# Patient Record
Sex: Male | Born: 1960
Health system: Southern US, Community
[De-identification: ages and names within clinical notes are randomized; demographics above are authoritative.]

## PROBLEM LIST (undated history)

## (undated) DIAGNOSIS — M48061 Spinal stenosis, lumbar region without neurogenic claudication: Secondary | ICD-10-CM

## (undated) DIAGNOSIS — I10 Essential (primary) hypertension: Secondary | ICD-10-CM

## (undated) DIAGNOSIS — M5417 Radiculopathy, lumbosacral region: Secondary | ICD-10-CM

## (undated) DIAGNOSIS — Z5189 Encounter for other specified aftercare: Secondary | ICD-10-CM

## (undated) DIAGNOSIS — R011 Cardiac murmur, unspecified: Secondary | ICD-10-CM

## (undated) DIAGNOSIS — F1910 Other psychoactive substance abuse, uncomplicated: Secondary | ICD-10-CM

## (undated) HISTORY — DX: Other psychoactive substance abuse, uncomplicated: F19.10

## (undated) HISTORY — PX: PANCREAS SURGERY: SHX731

## (undated) HISTORY — DX: Essential (primary) hypertension: I10

## (undated) HISTORY — DX: Cardiac murmur, unspecified: R01.1

## (undated) HISTORY — DX: Radiculopathy, lumbosacral region: M54.17

## (undated) HISTORY — PX: CORONARY ARTERY BYPASS GRAFT: SHX141

## (undated) HISTORY — DX: Spinal stenosis, lumbar region without neurogenic claudication: M48.061

## (undated) HISTORY — DX: Encounter for other specified aftercare: Z51.89

---

## 2004-02-21 ENCOUNTER — Emergency Department (HOSPITAL_COMMUNITY): Admission: EM | Admit: 2004-02-21 | Discharge: 2004-02-21 | Payer: Self-pay | Admitting: Family Medicine

## 2004-03-26 ENCOUNTER — Emergency Department (HOSPITAL_COMMUNITY): Admission: EM | Admit: 2004-03-26 | Discharge: 2004-03-26 | Payer: Self-pay | Admitting: Family Medicine

## 2008-02-15 ENCOUNTER — Emergency Department (HOSPITAL_COMMUNITY): Admission: EM | Admit: 2008-02-15 | Discharge: 2008-02-15 | Payer: Self-pay | Admitting: Emergency Medicine

## 2008-10-30 ENCOUNTER — Emergency Department (HOSPITAL_COMMUNITY): Admission: EM | Admit: 2008-10-30 | Discharge: 2008-10-30 | Payer: Self-pay | Admitting: Emergency Medicine

## 2008-11-01 ENCOUNTER — Emergency Department (HOSPITAL_COMMUNITY): Admission: EM | Admit: 2008-11-01 | Discharge: 2008-11-01 | Payer: Self-pay | Admitting: Emergency Medicine

## 2009-03-22 ENCOUNTER — Emergency Department (HOSPITAL_COMMUNITY): Admission: EM | Admit: 2009-03-22 | Discharge: 2009-03-22 | Payer: Self-pay | Admitting: Family Medicine

## 2009-06-04 ENCOUNTER — Emergency Department (HOSPITAL_COMMUNITY): Admission: EM | Admit: 2009-06-04 | Discharge: 2009-06-04 | Payer: Self-pay | Admitting: Family Medicine

## 2009-06-05 ENCOUNTER — Ambulatory Visit: Payer: Self-pay | Admitting: Family Medicine

## 2009-06-05 LAB — CONVERTED CEMR LAB
ALT: 15 units/L (ref 0–53)
BUN: 16 mg/dL (ref 6–23)
CO2: 25 meq/L (ref 19–32)
Calcium: 9.4 mg/dL (ref 8.4–10.5)
Creatinine, Ser: 1.31 mg/dL (ref 0.40–1.50)
Eosinophils Relative: 1 % (ref 0–5)
Monocytes Absolute: 0.6 10*3/uL (ref 0.1–1.0)
Neutro Abs: 4.8 10*3/uL (ref 1.7–7.7)
Platelets: 278 10*3/uL (ref 150–400)
RBC: 4.96 M/uL (ref 4.22–5.81)
RDW: 13.9 % (ref 11.5–15.5)
Sodium: 140 meq/L (ref 135–145)
Total Protein: 7.5 g/dL (ref 6.0–8.3)
Vit D, 25-Hydroxy: 37 ng/mL (ref 30–89)
WBC: 7.9 10*3/uL (ref 4.0–10.5)

## 2009-06-08 ENCOUNTER — Ambulatory Visit (HOSPITAL_COMMUNITY): Admission: RE | Admit: 2009-06-08 | Discharge: 2009-06-08 | Payer: Self-pay | Admitting: Family Medicine

## 2009-07-16 ENCOUNTER — Ambulatory Visit: Payer: Self-pay | Admitting: Internal Medicine

## 2010-02-04 ENCOUNTER — Encounter
Admission: RE | Admit: 2010-02-04 | Discharge: 2010-02-05 | Payer: Self-pay | Source: Home / Self Care | Attending: Physical Medicine & Rehabilitation | Admitting: Physical Medicine & Rehabilitation

## 2010-02-05 ENCOUNTER — Ambulatory Visit
Admission: RE | Admit: 2010-02-05 | Discharge: 2010-02-05 | Payer: Self-pay | Source: Home / Self Care | Attending: Physical Medicine & Rehabilitation | Admitting: Physical Medicine & Rehabilitation

## 2010-02-19 ENCOUNTER — Encounter
Admission: RE | Admit: 2010-02-19 | Discharge: 2010-03-05 | Payer: Self-pay | Source: Home / Self Care | Attending: Physical Medicine & Rehabilitation | Admitting: Physical Medicine & Rehabilitation

## 2010-03-06 ENCOUNTER — Encounter: Payer: Self-pay | Admitting: Rehabilitative and Restorative Service Providers"

## 2010-03-11 ENCOUNTER — Encounter: Payer: Self-pay | Admitting: Physical Medicine & Rehabilitation

## 2010-03-11 ENCOUNTER — Ambulatory Visit: Payer: Self-pay | Attending: Physical Medicine & Rehabilitation

## 2010-03-11 DIAGNOSIS — IMO0002 Reserved for concepts with insufficient information to code with codable children: Secondary | ICD-10-CM

## 2010-03-11 DIAGNOSIS — M48061 Spinal stenosis, lumbar region without neurogenic claudication: Secondary | ICD-10-CM

## 2010-03-13 ENCOUNTER — Encounter: Payer: Self-pay | Admitting: Rehabilitative and Restorative Service Providers"

## 2010-03-14 ENCOUNTER — Encounter: Payer: Self-pay | Admitting: Rehabilitative and Restorative Service Providers"

## 2010-03-28 ENCOUNTER — Encounter (INDEPENDENT_AMBULATORY_CARE_PROVIDER_SITE_OTHER): Payer: Self-pay | Admitting: *Deleted

## 2010-03-28 LAB — CONVERTED CEMR LAB: Testosterone: 296.91 ng/dL (ref 250–890)

## 2010-04-15 ENCOUNTER — Encounter: Payer: Medicaid Other | Attending: Physical Medicine & Rehabilitation

## 2010-04-15 ENCOUNTER — Encounter: Payer: Self-pay | Admitting: Physical Medicine & Rehabilitation

## 2010-04-15 DIAGNOSIS — M545 Low back pain, unspecified: Secondary | ICD-10-CM | POA: Insufficient documentation

## 2010-04-15 DIAGNOSIS — IMO0002 Reserved for concepts with insufficient information to code with codable children: Secondary | ICD-10-CM | POA: Insufficient documentation

## 2010-04-24 LAB — POCT I-STAT, CHEM 8
BUN: 18 mg/dL (ref 6–23)
Creatinine, Ser: 1.4 mg/dL (ref 0.4–1.5)
Hemoglobin: 16 g/dL (ref 13.0–17.0)
Potassium: 3.7 mEq/L (ref 3.5–5.1)
Sodium: 139 mEq/L (ref 135–145)

## 2010-06-11 ENCOUNTER — Ambulatory Visit (HOSPITAL_BASED_OUTPATIENT_CLINIC_OR_DEPARTMENT_OTHER): Payer: Self-pay | Admitting: Physical Medicine & Rehabilitation

## 2010-06-11 ENCOUNTER — Encounter: Payer: Medicaid Other | Attending: Physical Medicine & Rehabilitation

## 2010-06-11 DIAGNOSIS — IMO0002 Reserved for concepts with insufficient information to code with codable children: Secondary | ICD-10-CM

## 2010-06-11 DIAGNOSIS — M48061 Spinal stenosis, lumbar region without neurogenic claudication: Secondary | ICD-10-CM

## 2010-06-11 DIAGNOSIS — M545 Low back pain, unspecified: Secondary | ICD-10-CM | POA: Insufficient documentation
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
## 2010-06-24 ENCOUNTER — Other Ambulatory Visit: Payer: Self-pay | Admitting: Neurosurgery

## 2010-06-24 DIAGNOSIS — M5126 Other intervertebral disc displacement, lumbar region: Secondary | ICD-10-CM

## 2010-06-24 DIAGNOSIS — M545 Low back pain: Secondary | ICD-10-CM

## 2010-06-26 ENCOUNTER — Other Ambulatory Visit: Payer: Self-pay

## 2010-07-02 ENCOUNTER — Other Ambulatory Visit: Payer: Self-pay

## 2010-07-02 ENCOUNTER — Inpatient Hospital Stay: Admission: RE | Admit: 2010-07-02 | Payer: Self-pay | Source: Ambulatory Visit

## 2010-08-06 ENCOUNTER — Ambulatory Visit (HOSPITAL_BASED_OUTPATIENT_CLINIC_OR_DEPARTMENT_OTHER): Payer: Medicaid Other | Admitting: Physical Medicine & Rehabilitation

## 2010-08-06 ENCOUNTER — Encounter: Payer: Medicaid Other | Attending: Physical Medicine & Rehabilitation

## 2010-08-06 DIAGNOSIS — IMO0002 Reserved for concepts with insufficient information to code with codable children: Secondary | ICD-10-CM

## 2010-08-06 DIAGNOSIS — M545 Low back pain, unspecified: Secondary | ICD-10-CM | POA: Insufficient documentation

## 2010-08-06 NOTE — Assessment & Plan Note (Signed)
HISTORY:  Mr.  Alex Hogan returns today.  He is seen by Neurosurgery, did not feel operative treatment of this was necessary.  L2-3 herniated nucleus pulposus with right thigh pain.  Has responded in the past to epidural steroid injection.  He is no longer taking his Neurontin or Arthrotec, this can be refilled.  He has had UDS positive for THC.  8/10 pain.  PHYSICAL EXAMINATION:  VITAL SIGNS:  139/70, pulse 79, respirations 12, and O2 sat 98% on room air. GENERAL:  No acute stress.  Mood and affect appropriate.  Straight leg raising test negative.  He has no weakness in the lower extremity.  His back has mild tenderness mainly pain with forward flexion, not with extension.  IMPRESSION: 1. Lumbar disk L2-3.  Repeat lumbar epidural steroid injection. 2. Resume Arthrotec 75 b.i.d. and Neurontin 300 t.i.d.  Discussed with the patient, agrees with plan.     Erick Colace, M.D. Electronically Signed    AEK/MedQ D:  08/06/2010 11:12:53  T:  08/06/2010 14:14:42  Job #:  [id number]

## 2010-09-05 ENCOUNTER — Encounter: Payer: Medicaid Other | Attending: Physical Medicine & Rehabilitation

## 2010-09-05 ENCOUNTER — Encounter (HOSPITAL_BASED_OUTPATIENT_CLINIC_OR_DEPARTMENT_OTHER): Payer: Medicaid Other | Admitting: Physical Medicine & Rehabilitation

## 2010-09-05 DIAGNOSIS — M545 Low back pain, unspecified: Secondary | ICD-10-CM | POA: Insufficient documentation

## 2010-09-05 DIAGNOSIS — IMO0002 Reserved for concepts with insufficient information to code with codable children: Secondary | ICD-10-CM | POA: Insufficient documentation

## 2010-09-09 NOTE — Procedures (Signed)
Alex Hogan, Alex Hogan            ACCOUNT NO.:  [id number]  MEDICAL RECORD NO.:  [id number]           PATIENT TYPE:  LOCATION:                                 FACILITY:  PHYSICIAN:  Erick Colace, M.D.DATE OF BIRTH:  1960/05/29  DATE OF PROCEDURE:  09/05/2010 DATE OF DISCHARGE:                              OPERATIVE REPORT  PROCEDURE:  Right L2-3 paramedian translaminar lumbar epidural steroid injection under fluoroscopic guidance.  INDICATION:  Lumbar pain radiating to the right thigh with previous good results from L2-3 right paramedian injection on April 15, 2010.  He is here for repeat injection and pain is only partially responsive to medication management, other conservative care, and interferes with self- care and mobility.  Informed consent was obtained after describing risks and benefits of the procedure with the patient.  These include bleeding, bruising, and infection.  He elects to proceed and has given written consent.  The patient was placed prone on fluoroscopy table.  Betadine prep, sterile drape, 25-gauge inch and half needle was used to anesthetize skin and subcu tissue 1% lidocaine x2 mL.  Then, a 22-gauge 3-1/2-inch spinal needle was inserted under fluoroscopic guidance, starting right L2-3 interlaminar space.  AP and lateral images were utilized.  Omnipaque 180 under live fluoro demonstrated good epidural spread after loss-of- resistance demonstrated possible loss under lateral imaging.  Then, a solution containing 2 mL of 40 mg/mL Depo-Medrol and 2 mL of 1% MPF lidocaine were injected.  The patient tolerated the procedure well. Postprocedure instructions given.  We will schedule repeat injection in about 4 months.     Erick Colace, M.D. Electronically Signed    AEK/MEDQ  D:  09/05/2010 12:31:02  T:  09/05/2010 13:13:44  Job:  [id number]

## 2010-12-23 ENCOUNTER — Encounter (HOSPITAL_BASED_OUTPATIENT_CLINIC_OR_DEPARTMENT_OTHER): Payer: Medicaid Other | Admitting: Physical Medicine & Rehabilitation

## 2010-12-23 ENCOUNTER — Encounter: Payer: Medicaid Other | Attending: Physical Medicine & Rehabilitation

## 2010-12-23 DIAGNOSIS — M545 Low back pain, unspecified: Secondary | ICD-10-CM | POA: Insufficient documentation

## 2010-12-23 DIAGNOSIS — IMO0002 Reserved for concepts with insufficient information to code with codable children: Secondary | ICD-10-CM | POA: Insufficient documentation

## 2010-12-23 NOTE — Procedures (Signed)
[name], [name]            ACCOUNT NO.:  [id number]  MEDICAL RECORD NO.:  [id number]           PATIENT TYPE:  O  LOCATION:  [facility]                          FACILITY:  [facility]  PHYSICIAN:  Erick Colace, M.D.DATE OF BIRTH:  08/29/1960  DATE OF PROCEDURE:  12/23/2010 DATE OF DISCHARGE:                              OPERATIVE REPORT  PROCEDURE:  Right L2-3 translaminar lumbar epidural steroid injection under fluoroscopic guidance.  INDICATION:  Lumbar pain only partially response to medication management, other conservative care.  Informed consent was obtained after describing risks and benefits of the procedure with the patient.  These include bleeding, bruising, infection.  He elects proceed and has given written consent.  The patient placed prone on fluoroscopy table.  Betadine prep, sterile drape, 25-gauge inch and half needle was used to anesthetize skin and subcu tissue, 1% lidocaine x2 mL.  Then, a17-gauge Tuohy needle was inserted under fluoroscopic guidance, targeting the L2-3 interlaminar space.  AP and lateral images utilized.  Once needle tip approximated, posterior elements, loss of resistance using 50:50 air saline mix was used.  Possible loss resistance was confirmed with 2 mL of Omnipaque 180 followed by injection of 2 mL of 1% MPF lidocaine plus 2 mL of Kenalog 40 mg/mL.  The patient tolerated procedure well.  Postprocedure instructions given.     Erick Colace, M.D. Electronically Signed    AEK/MEDQ  D:  12/23/2010 10:55:13  T:  12/23/2010 12:25:01  Job:  [id number]

## 2011-03-17 ENCOUNTER — Encounter: Payer: Medicaid Other | Admitting: Physical Medicine & Rehabilitation

## 2011-03-17 ENCOUNTER — Encounter: Payer: Medicaid Other | Attending: Physical Medicine & Rehabilitation

## 2011-03-17 DIAGNOSIS — IMO0002 Reserved for concepts with insufficient information to code with codable children: Secondary | ICD-10-CM | POA: Insufficient documentation

## 2011-03-17 DIAGNOSIS — M545 Low back pain, unspecified: Secondary | ICD-10-CM | POA: Insufficient documentation

## 2011-03-18 NOTE — Procedures (Signed)
Alex Hogan, Alex Hogan            ACCOUNT NO.:  [id number]  MEDICAL RECORD NO.:  [id number]           PATIENT TYPE:  LOCATION:                                 FACILITY:  PHYSICIAN:  Erick Colace, M.D.DATE OF BIRTH:  19-Apr-1960  DATE OF PROCEDURE: DATE OF DISCHARGE:                              OPERATIVE REPORT  PROCEDURE:  Right L2-3 paramedian lumbar epidural steroid injection under fluoroscopic guidance.  INDICATION:  Lumbar spinal stenosis and lumbar radiculitis.  He gets about 3 months relief after epidural injections, not requiring medications at this point.  Last injection December 23, 2010.  Informed consent was obtained after describing risks and benefits of the procedure with the patient.  These include bleeding, bruising, and infection.  He elects to proceed and has given written consent.  Proper patient and proper procedure confirmed.  His radicular pain has not responded to other conservative care.  The patient was placed prone on fluoroscopy table.  Betadine prep, sterile drape, 25-gauge inch and half needle was used to anesthetize skin and subcu tissue with 1% lidocaine x2 mL.  Then, a 17-gauge Tuohy needle was inserted under fluoroscopic guidance, starting at the right L2-3 interlaminar space.  AP and lateral images utilized.  Once needle tip approximated posterior elements loss resistance, 50:50 air saline mix was used.  Loss resistance was confirmed with 2 mL of Omnipaque 180 demonstrating good epidural spread followed by injection of 2 mL of 1% MPF lidocaine and 2 mL of 40 mg/mL Depo-Medrol.  The patient tolerated the procedure well.  Postprocedure instructions given.     Erick Colace, M.D. Electronically Signed    AEK/MEDQ  D:  03/17/2011 11:45:32  T:  03/17/2011 23:14:00  Job:  [id number]

## 2011-06-10 ENCOUNTER — Ambulatory Visit (HOSPITAL_BASED_OUTPATIENT_CLINIC_OR_DEPARTMENT_OTHER): Payer: Medicaid Other | Admitting: Physical Medicine & Rehabilitation

## 2011-06-10 ENCOUNTER — Encounter: Payer: Medicaid Other | Attending: Physical Medicine & Rehabilitation

## 2011-06-10 ENCOUNTER — Encounter: Payer: Self-pay | Admitting: Physical Medicine & Rehabilitation

## 2011-06-10 VITALS — BP 134/82 | HR 56 | Ht 75.0 in | Wt 224.0 lb

## 2011-06-10 DIAGNOSIS — M545 Low back pain, unspecified: Secondary | ICD-10-CM | POA: Insufficient documentation

## 2011-06-10 DIAGNOSIS — M5416 Radiculopathy, lumbar region: Secondary | ICD-10-CM | POA: Insufficient documentation

## 2011-06-10 DIAGNOSIS — IMO0002 Reserved for concepts with insufficient information to code with codable children: Secondary | ICD-10-CM

## 2011-06-10 NOTE — Progress Notes (Addendum)
PROCEDURE: Right L2-3 paramedian lumbar epidural steroid injection  under fluoroscopic guidance.  INDICATION: Lumbar spinal stenosis and lumbar radiculitis. He gets  about 3 months relief after epidural injections, not requiring  medications at this point. Last injection December 23, 2010.  Informed consent was obtained after describing risks and benefits of the  procedure with the patient. These include bleeding, bruising, and  infection. He elects to proceed and has given written consent. Proper  patient and proper procedure confirmed. His radicular pain has not  responded to other conservative care. The patient was placed prone on  fluoroscopy table. Betadine prep, sterile drape, 25-gauge inch and half  needle was used to anesthetize skin and subcu tissue with 1% lidocaine  x2 mL. Then, a 17-gauge Tuohy needle was inserted under fluoroscopic  guidance, starting at the right L2-3 interlaminar space. AP and lateral  images utilized. Once needle tip approximated posterior elements loss  resistance, 50:50 air saline mix was used. Loss resistance was  confirmed with 2 mL of Omnipaque 180 demonstrating good epidural spread  followed by injection of 2 mL of 1% MPF lidocaine and 2 mL of 40 mg/mL   Please note there was an injection 03/17/11  Depo-Medrol. The patient tolerated the procedure well. Postprocedure  instructions given.

## 2011-06-10 NOTE — Progress Notes (Signed)
  PROCEDURE RECORD [facility]   Name: Alex Hogan DOB:Jun 12, 1960 MRN: [id number]  Date:06/10/2011  Physician: Alex Laws, MD    Nurse/CMA: Shumaker RN  Allergies:  Allergies  Allergen Reactions  . Reglan (Metoclopramide Hcl)     Consent Signed: yes  Is patient diabetic? no  CBG today?   Pregnant: no LMP: No LMP for male patient. (age 52-55)  Anticoagulants: no Anti-inflammatory: no Antibiotics: no  Procedure: Lumbar Epidural Steroid Injection L2-3 Position: Prone Start Time: 11:14am End Time: 11:18am Fluoro Time: 15 sec  RN/CMA Harley Fitzwater Shumaker RN    Time 10:46 am 11:21    BP 134/82   154/80    Pulse 56 55    Respirations 14   16    O2 Sat 98 100%    S/S 6 6    Pain Level 1 0     D/C home with Alex (wife), patient A & O X 3, D/C instructions reviewed, and sits independently.

## 2011-06-10 NOTE — Patient Instructions (Signed)
Epidural Steroid Injection Care After  Refer to this sheet in the next few weeks. These instructions provide you with information on caring for yourself after your procedure. Your caregiver may also give you more specific instructions. Your treatment has been planned according to current medical practices, but problems sometimes occur. Call your caregiver if you have any problems or questions after your procedure. HOME CARE INSTRUCTIONS   Avoid the use of heat on the injection site for a day.   Do not have a tub bath or soak in water for the rest of the day.   Remove the bandage on the next day.   Resume your normal activities on the next day.   Use ice packs or mild pain relievers to reduce the soreness around the injection site.   Follow up with your caregiver 7 to 10 days after the procedure.  SEEK MEDICAL CARE IF:   You develop a fever of more than 100.5 F (38.1 C).   You continue to have pain and soreness over the injection site even after taking medicines.   You develop significant nausea or vomiting.  SEEK IMMEDIATE MEDICAL CARE IF:   You have severe back pain, which is not relieved by medicines.   You develop severe headache, stiff neck, or sensitivity to light.   You develop any new numbness or weakness of your legs.   You lose control over your bladder or bowel movements.   You develop a fever of more than 102 F (38.9 C).   You develop difficulty breathing.  Document Released: 05/07/2010 Document Revised: 01/09/2011 Document Reviewed: 05/07/2010 [employer] Patient Information 2012 [employer]. 

## 2011-09-04 ENCOUNTER — Ambulatory Visit: Payer: Medicaid Other | Admitting: Physical Medicine & Rehabilitation

## 2011-09-04 ENCOUNTER — Encounter: Payer: Medicaid Other | Attending: Physical Medicine & Rehabilitation

## 2011-09-04 DIAGNOSIS — IMO0002 Reserved for concepts with insufficient information to code with codable children: Secondary | ICD-10-CM | POA: Insufficient documentation

## 2011-09-04 DIAGNOSIS — M545 Low back pain, unspecified: Secondary | ICD-10-CM | POA: Insufficient documentation

## 2011-10-23 ENCOUNTER — Ambulatory Visit: Payer: Medicaid Other | Admitting: Physical Medicine & Rehabilitation

## 2011-10-23 ENCOUNTER — Encounter: Payer: Medicaid Other | Attending: Physical Medicine & Rehabilitation

## 2011-10-23 ENCOUNTER — Encounter: Payer: Self-pay | Admitting: Physical Medicine & Rehabilitation

## 2011-10-23 DIAGNOSIS — IMO0002 Reserved for concepts with insufficient information to code with codable children: Secondary | ICD-10-CM | POA: Insufficient documentation

## 2011-10-23 DIAGNOSIS — M545 Low back pain, unspecified: Secondary | ICD-10-CM | POA: Insufficient documentation

## 2011-10-23 NOTE — Progress Notes (Unsigned)
  PROCEDURE RECORD [facility]   Name: Alex Hogan DOB:12-25-1960 MRN: [id number]  Date:10/23/2011  Physician: Claudette Laws, MD    Nurse/CMA: Shemica Meath/Carroll  Allergies:  Allergies  Allergen Reactions  . Reglan (Metoclopramide Hcl)     Consent Signed: yes  Is patient diabetic? no   Pregnant: no LMP: No LMP for male patient. (age 52-55)  Anticoagulants: no Anti-inflammatory: no Antibiotics: no  Procedure: lumbar epidural steroid injection  Position: Prone Start Time:   End Time:   Fluoro Time:   RN/CMA Sanita Estrada     Time 10:05 am     BP 142/81     Pulse 61     Respirations 14     O2 Sat 99     S/S 6 6    Pain Level 7/10      D/C home with Claudette-wife, patient A & O X 3, D/C instructions reviewed, and sits independently.

## 2011-10-28 ENCOUNTER — Telehealth: Payer: Self-pay | Admitting: *Deleted

## 2011-10-28 ENCOUNTER — Ambulatory Visit: Payer: Medicaid Other | Admitting: Physical Medicine & Rehabilitation

## 2011-10-28 NOTE — Telephone Encounter (Signed)
Cannot come in for appt today because his wife is in the hospital. Please call about appointment.  # [phone]

## 2011-12-02 ENCOUNTER — Encounter: Payer: Self-pay | Admitting: Physical Medicine & Rehabilitation

## 2011-12-02 ENCOUNTER — Ambulatory Visit (HOSPITAL_BASED_OUTPATIENT_CLINIC_OR_DEPARTMENT_OTHER): Payer: Medicaid Other | Admitting: Physical Medicine & Rehabilitation

## 2011-12-02 ENCOUNTER — Encounter: Payer: Medicaid Other | Attending: Physical Medicine & Rehabilitation

## 2011-12-02 VITALS — BP 121/70 | HR 62 | Resp 14 | Ht 75.0 in | Wt 221.0 lb

## 2011-12-02 DIAGNOSIS — M5416 Radiculopathy, lumbar region: Secondary | ICD-10-CM

## 2011-12-02 DIAGNOSIS — M545 Low back pain, unspecified: Secondary | ICD-10-CM | POA: Insufficient documentation

## 2011-12-02 DIAGNOSIS — IMO0002 Reserved for concepts with insufficient information to code with codable children: Secondary | ICD-10-CM | POA: Insufficient documentation

## 2011-12-02 NOTE — Progress Notes (Signed)
  PROCEDURE RECORD [facility]   Name: Alex Hogan DOB:[birth year] MRN: [id number]  Date:12/02/2011  Physician: Claudette Laws, MD    Nurse/CMA: Kelli Churn,  Allergies:  Allergies  Allergen Reactions  . Reglan (Metoclopramide Hcl)     Consent Signed: yes  Is patient diabetic? no    Pregnant: no LMP: No LMP for male patient. (age 52-55)  Anticoagulants: no Anti-inflammatory: no Antibiotics: no  Procedure:  Lumbar epidural injection Position: Prone Start Time:  122 End Time: 130p  Fluoro Time:15   RN/CMA Levens, CMA Levens, CMA    Time 111 133    BP 121/70 141/77    Pulse 62 67    Respirations 14 14    O2 Sat 98 100    S/S 6 6    Pain Level 9/10 6/10     D/C home with [name], patient A & O X 3, D/C instructions reviewed, and sits independently.

## 2011-12-02 NOTE — Progress Notes (Signed)
PROCEDURE: Right L2-3 paramedian lumbar epidural steroid injection  under fluoroscopic guidance.  INDICATION: Lumbar spinal stenosis and lumbar radiculitis. He gets  about 3 months relief after epidural injections, not requiring  medications at this point. Last injection December 23, 2010.  Informed consent was obtained after describing risks and benefits of the  procedure with the patient. These include bleeding, bruising, and  infection. He elects to proceed and has given written consent. Proper  patient and proper procedure confirmed. His radicular pain has not  responded to other conservative care. The patient was placed prone on  fluoroscopy table. Betadine prep, sterile drape, 25-gauge inch and half  needle was used to anesthetize skin and subcu tissue with 1% lidocaine  x2 mL. Then, a 17-gauge Tuohy needle was inserted under fluoroscopic  guidance, starting at the right L2-3 interlaminar space. AP and lateral  images utilized. Once needle tip approximated posterior elements loss  resistance, 50:50 air saline mix was used. Loss resistance was  confirmed with 2 mL of Omnipaque 180 demonstrating good epidural spread  followed by injection of 2 mL of 1% MPF lidocaine and 2 mL of 40 mg/mL  Depo-Medrol. The patient tolerated the procedure well. Postprocedure  instructions given.

## 2011-12-02 NOTE — Patient Instructions (Signed)
Epidural Steroid Injection Care After  Refer to this sheet in the next few weeks. These instructions provide you with information on caring for yourself after your procedure. Your caregiver may also give you more specific instructions. Your treatment has been planned according to current medical practices, but problems sometimes occur. Call your caregiver if you have any problems or questions after your procedure. HOME CARE INSTRUCTIONS   Avoid the use of heat on the injection site for a day.  Do not have a tub bath or soak in water for the rest of the day.  Remove the bandage on the next day.  Resume your normal activities on the next day.  Use ice packs or mild pain relievers to reduce the soreness around the injection site.  Follow up with your caregiver 7 to 10 days after the procedure. SEEK MEDICAL CARE IF:   You develop a fever of more than 100.5 F (38.1 C).  You continue to have pain and soreness over the injection site even after taking medicines.  You develop significant nausea or vomiting. SEEK IMMEDIATE MEDICAL CARE IF:   You have severe back pain, which is not relieved by medicines.  You develop severe headache, stiff neck, or sensitivity to light.  You develop any new numbness or weakness of your legs.  You lose control over your bladder or bowel movements.  You develop a fever of more than 102 F (38.9 C).  You develop difficulty breathing. Document Released: 05/07/2010 Document Revised: 04/14/2011 Document Reviewed: 05/07/2010 Exit[employer] Patient Information 2013 [employer].  

## 2012-03-04 ENCOUNTER — Encounter: Payer: Medicaid Other | Attending: Physical Medicine & Rehabilitation

## 2012-03-04 ENCOUNTER — Ambulatory Visit: Payer: Medicaid Other | Admitting: Physical Medicine & Rehabilitation

## 2015-06-20 ENCOUNTER — Encounter: Payer: Self-pay | Admitting: Family

## 2015-06-20 ENCOUNTER — Ambulatory Visit (INDEPENDENT_AMBULATORY_CARE_PROVIDER_SITE_OTHER): Payer: Medicare Other | Admitting: Family

## 2015-06-20 VITALS — BP 162/92 | HR 57 | Temp 98.0°F | Resp 16 | Ht 75.0 in | Wt 248.0 lb

## 2015-06-20 DIAGNOSIS — I10 Essential (primary) hypertension: Secondary | ICD-10-CM | POA: Diagnosis not present

## 2015-06-20 DIAGNOSIS — N529 Male erectile dysfunction, unspecified: Secondary | ICD-10-CM | POA: Diagnosis not present

## 2015-06-20 DIAGNOSIS — Z79891 Long term (current) use of opiate analgesic: Secondary | ICD-10-CM | POA: Diagnosis not present

## 2015-06-20 DIAGNOSIS — M5416 Radiculopathy, lumbar region: Secondary | ICD-10-CM

## 2015-06-20 MED ORDER — OLMESARTAN MEDOXOMIL-HCTZ 40-25 MG PO TABS: 1.0000 | ORAL_TABLET | Freq: Every day | ORAL | Status: DC

## 2015-06-20 MED ORDER — HYDROCODONE-ACETAMINOPHEN 5-325 MG PO TABS: 1.0000 | ORAL_TABLET | Freq: Three times a day (TID) | ORAL | Status: DC | PRN

## 2015-06-20 NOTE — Assessment & Plan Note (Signed)
Hypertension remains uncontrolled and above goal 140/90 with current regimen. Discontinue lisinopril-hydrochlorothiazide per patient request. Start on olmesartan-hydrochlorothiazide. Encouraged to check blood pressures at home. Decrease sodium in diet and physical activity as tolerated.

## 2015-06-20 NOTE — Progress Notes (Signed)
Pre visit review using our clinic review tool, if applicable. No additional management support is needed unless otherwise documented below in the visit note. 

## 2015-06-20 NOTE — Assessment & Plan Note (Signed)
Lumbar back pain with previous injections and pain management. Patient appears rather functional with assessment today with the possibility of flares at times. States that he cannot function adequately with a low dose pain medication and requires multiple times throughout the day. Will continue hydrocodone-acetaminophen at this time. He is high risk for abuse given family history and previous addictive behaviors. [place] Controlled Substance Database reviewed with no irregularities and UDS performed. Will refer to pain management for further options.
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
## 2015-06-20 NOTE — Patient Instructions (Addendum)
Thank you for choosing [facility].  Summary/Instructions:  Please continue to take your medications as prescribed.   Our office will call to schedule an appointment for pain management.  Please discontinue the lisinopril-hydrochlorothiazide and start Benicar HCT.  Monitor blood pressure at home.  Continue Viagra as needed for erectile dysfunction.  Your prescription(s) have been submitted to your pharmacy or been printed and provided for you. Please take as directed and contact our office if you believe you are having problem(s) with the medication(s) or have any questions.  If your symptoms worsen or fail to improve, please contact our office for further instruction, or in case of emergency go directly to the emergency room at the closest medical facility.

## 2015-06-20 NOTE — Assessment & Plan Note (Signed)
Erectile dysfunction most likely multifactorial in nature between hypertension, hypertensive medications, and possible back pain. Patient did describe a history of low testosterone. Continue current dosage of Viagra. May require referral to Urology.

## 2015-07-17 ENCOUNTER — Encounter: Payer: Self-pay | Admitting: Family

## 2015-07-19 ENCOUNTER — Ambulatory Visit (INDEPENDENT_AMBULATORY_CARE_PROVIDER_SITE_OTHER): Payer: Medicare Other | Admitting: Family

## 2015-07-19 ENCOUNTER — Encounter: Payer: Self-pay | Admitting: Family

## 2015-07-19 VITALS — BP 120/82 | HR 73 | Temp 97.8°F | Resp 14 | Ht 75.0 in | Wt 243.0 lb

## 2015-07-19 DIAGNOSIS — N529 Male erectile dysfunction, unspecified: Secondary | ICD-10-CM

## 2015-07-19 DIAGNOSIS — Z1211 Encounter for screening for malignant neoplasm of colon: Secondary | ICD-10-CM

## 2015-07-19 DIAGNOSIS — I10 Essential (primary) hypertension: Secondary | ICD-10-CM | POA: Diagnosis not present

## 2015-07-19 DIAGNOSIS — M5416 Radiculopathy, lumbar region: Secondary | ICD-10-CM | POA: Diagnosis not present

## 2015-07-19 MED ORDER — SILDENAFIL CITRATE 100 MG PO TABS: 100.0000 mg | ORAL_TABLET | Freq: Every day | ORAL | Status: DC | PRN

## 2015-07-19 MED ORDER — HYDROCODONE-ACETAMINOPHEN 7.5-325 MG PO TABS: 1.0000 | ORAL_TABLET | Freq: Three times a day (TID) | ORAL | Status: DC | PRN

## 2015-07-19 NOTE — Assessment & Plan Note (Signed)
Erectile dysfunction appears adequately controlled with some difficulties maintaining erection with current medication dosage. Discussed possibility of referral to urology. Patient would like to trial another month. Continue current dosage of Viagra. Follow-up in one month or sooner if needed.

## 2015-07-19 NOTE — Progress Notes (Signed)
Subjective:    Patient ID: Alex Hogan, male    DOB: 06-12-1960, 55 y.o.   MRN: [id number]  Chief Complaint  Patient presents with  . Follow-up    wants to talk about ED and refill of medications    HPI:  Alex Hogan is a 55 y.o. male who  has a past medical history of Spinal stenosis, lumbar region, without neurogenic claudication; Lumbosacral neuritis; Heart murmur; Hypertension; Lumbosacral radiculitis; and Substance abuse. and presents today for a follow up office visit.   1.) Hypertension - Currently maintained on olmesartan-hydrochlorothiazide. Reports taking the medication as prescribed and denies adverse side effects. No symptoms of end organ damage. Reports cutting down on his sodium and working on increasing physical activity.   BP Readings from Last 3 Encounters:  07/19/15 120/82  06/20/15 162/92  12/02/11 121/70   2.) Lumbar radiculopathy - Currently maintained on hydrocodone-acetaminophen with referral to pain management pending. Reports taking the medication as prescribed and denies adverse side effects. Severity of the pain is rated 8/10 with minimal medication. Does take 2 tablets at night to help him sleep with the pain. Functionality is able to complete his activities of daily living. Symptoms are generally worse in the morning and progress over the course of the day. Denies constipation.    3.) Erectile dysfunction - Currently maintained on Viagra. Reports that the medication helps a little, but continues to have difficulty maintained an erection.   Allergies  Allergen Reactions  . Reglan [Metoclopramide Hcl]      Current Outpatient Prescriptions on File Prior to Visit  Medication Sig Dispense Refill  . olmesartan-hydrochlorothiazide (BENICAR HCT) 40-25 MG tablet Take 1 tablet by mouth daily. 30 tablet 1   No current facility-administered medications on file prior to visit.     Past Surgical History  Procedure Laterality Date  . Coronary  artery bypass graft      Past Medical History  Diagnosis Date  . Spinal stenosis, lumbar region, without neurogenic claudication   . Lumbosacral neuritis   . Heart murmur   . Hypertension   . Lumbosacral radiculitis   . Substance abuse      Review of Systems  Constitutional: Negative for fever and chills.  Respiratory: Negative for chest tightness and shortness of breath.   Cardiovascular: Negative for chest pain, palpitations and leg swelling.  Genitourinary:       Positive for erectile dysfunction.  Musculoskeletal: Positive for back pain.      Objective:    BP 120/82 mmHg  Pulse 73  Temp(Src) 97.8 F (36.6 C) (Oral)  Resp 14  Ht 6\' 3"  (1.905 m)  Wt 243 lb (110.224 kg)  BMI 30.37 kg/m2  SpO2 99% Nursing note and vital signs reviewed.  Physical Exam  Constitutional: He is oriented to person, place, and time. He appears well-developed and well-nourished. No distress.  Cardiovascular: Normal rate, regular rhythm, normal heart sounds and intact distal pulses.   Pulmonary/Chest: Effort normal and breath sounds normal.  Neurological: He is alert and oriented to person, place, and time.  Skin: Skin is warm and dry.  Psychiatric: He has a normal mood and affect. His behavior is normal. Judgment and thought content normal.       Assessment & Plan:   Problem List Items Addressed This Visit      Cardiovascular and Mediastinum   Essential hypertension - Primary    Hypertension is adequately controlled current regimen and below goal 140/90 with no adverse side effects.  No symptoms of end organ damage or worse headache of life. Continue current dosage of olmesartan-hydrochlorothiazide. Follow-up in 3 months or sooner if needed.      Relevant Medications   sildenafil (VIAGRA) 100 MG tablet     Nervous and Auditory   Lumbar radiculitis    Continues to experience lumbar radiculitis which is adequately managed with room for improvement per patient. Denies constipation. He  continues to remain functional and able to complete activities of daily living. [place] controlled substance database reviewed with no irregularities. Increase hydrocodone-Acetaminophen 7.5-325. Continue nonpharmacological therapies for symptom relief. Pending referral to pain management. Follow-up in one month or sooner if needed.      Relevant Medications   HYDROcodone-acetaminophen (NORCO) 7.5-325 MG tablet     Genitourinary   Erectile dysfunction    Erectile dysfunction appears adequately controlled with some difficulties maintaining erection with current medication dosage. Discussed possibility of referral to urology. Patient would like to trial another month. Continue current dosage of Viagra. Follow-up in one month or sooner if needed.      Relevant Medications   sildenafil (VIAGRA) 100 MG tablet    Other Visit Diagnoses    Encounter for screening colonoscopy        Relevant Orders    Ambulatory referral to Gastroenterology       I have discontinued Alex Hogan's HYDROcodone-acetaminophen. I have also changed his sildenafil. Additionally, I am having him start on HYDROcodone-acetaminophen. Lastly, I am having him maintain his olmesartan-hydrochlorothiazide.   Follow-up: Return in about 1 month (around 08/18/2015).  Mauricio Po, FNP

## 2015-07-19 NOTE — Assessment & Plan Note (Addendum)
Continues to experience lumbar radiculitis which is adequately managed with room for improvement per patient. Denies constipation. He continues to remain functional and able to complete activities of daily living. [place] controlled substance database reviewed with no irregularities. Increase hydrocodone-Acetaminophen 7.5-325. Continue nonpharmacological therapies for symptom relief. Pending referral to pain management. Follow-up in one month or sooner if needed.

## 2015-07-19 NOTE — Progress Notes (Signed)
Pre visit review using our clinic review tool, if applicable. No additional management support is needed unless otherwise documented below in the visit note. 

## 2015-07-19 NOTE — Assessment & Plan Note (Signed)
Hypertension is adequately controlled current regimen and below goal 140/90 with no adverse side effects. No symptoms of end organ damage or worse headache of life. Continue current dosage of olmesartan-hydrochlorothiazide. Follow-up in 3 months or sooner if needed.

## 2015-07-19 NOTE — Patient Instructions (Signed)
Thank you for choosing [facility].  Summary/Instructions:  Please continue to take your medications as prescribed.   Your prescription(s) have been submitted to your pharmacy or been printed and provided for you. Please take as directed and contact our office if you believe you are having problem(s) with the medication(s) or have any questions.  If your symptoms worsen or fail to improve, please contact our office for further instruction, or in case of emergency go directly to the emergency room at the closest medical facility.   The goal of pain medication is to ease the pain, but it will not get rid of it completely. Do not take more pain medicine than needed.   Do NOT drive while taking narcotic pain medications.   Do NOT drink alcohol while taking prescription pain medication.   Please use over-the-counter medications for constipation, such as miralax, senakot, colace, or magnesium citrate, while you are taking narcotic pain medication.  Pay attention to the side effects of any pain medication  Do NOT exceed 3000 mg of acetaminophen (Tylenol) TOTAL in a 24 hour period. (Please be aware that you may have acetaminophen in other medications that you are taking.)

## 2015-07-27 ENCOUNTER — Encounter: Payer: Self-pay | Admitting: Internal Medicine

## 2015-07-30 ENCOUNTER — Telehealth: Payer: Self-pay

## 2015-07-30 NOTE — Telephone Encounter (Signed)
Patient walked in today and needs a refill on the following medications.  sildenafil (VIAGRA) 100 MG tablet [[id number]]       sildenafil (VIAGRA) 100 MG tablet [id number]     The number he said you may need to call is [phone] The medication number is [id number]  Also he got a letter saying that is needs a p.a on the following medication for him to be able to continue to take it.   olmesartan-hydrochlorothiazide (BENICAR HCT) 40-25 MG tablet [id number]   Please follow up, thank you.

## 2015-07-30 NOTE — Telephone Encounter (Signed)
Notified pt we called [facility] order for viagra. Filled date will be July 2nd confirm# [id number]. Forwarding to [place] to complete PA...Johny Chess

## 2015-07-31 NOTE — Telephone Encounter (Signed)
Per pharmacist PA is required on Brand Name [place], pt picked Rx for generic [phone] - insurance covered.  PA is not required

## 2015-08-16 ENCOUNTER — Other Ambulatory Visit: Payer: Self-pay | Admitting: *Deleted

## 2015-08-16 DIAGNOSIS — I10 Essential (primary) hypertension: Secondary | ICD-10-CM

## 2015-08-16 MED ORDER — OLMESARTAN MEDOXOMIL-HCTZ 40-25 MG PO TABS: 1.0000 | ORAL_TABLET | Freq: Every day | ORAL | Status: DC

## 2015-08-20 ENCOUNTER — Other Ambulatory Visit (INDEPENDENT_AMBULATORY_CARE_PROVIDER_SITE_OTHER): Payer: Medicare Other

## 2015-08-20 ENCOUNTER — Ambulatory Visit (INDEPENDENT_AMBULATORY_CARE_PROVIDER_SITE_OTHER): Payer: Medicare Other | Admitting: Family

## 2015-08-20 ENCOUNTER — Telehealth: Payer: Self-pay | Admitting: Emergency Medicine

## 2015-08-20 ENCOUNTER — Encounter: Payer: Self-pay | Admitting: Family

## 2015-08-20 VITALS — BP 122/78 | HR 60 | Temp 98.2°F | Resp 20 | Wt 242.0 lb

## 2015-08-20 DIAGNOSIS — I10 Essential (primary) hypertension: Secondary | ICD-10-CM | POA: Diagnosis not present

## 2015-08-20 DIAGNOSIS — M5416 Radiculopathy, lumbar region: Secondary | ICD-10-CM | POA: Diagnosis not present

## 2015-08-20 LAB — BASIC METABOLIC PANEL
BUN: 21 mg/dL (ref 6–23)
CALCIUM: 10.1 mg/dL (ref 8.4–10.5)
CO2: 28 meq/L (ref 19–32)
Chloride: 100 mEq/L (ref 96–112)
Creatinine, Ser: 1.44 mg/dL (ref 0.40–1.50)
GFR: 65.32 mL/min (ref >60.00)
GLUCOSE: 93 mg/dL (ref 70–99)
Potassium: 4.5 mEq/L (ref 3.5–5.1)
SODIUM: 134 meq/L — AB (ref 135–145)

## 2015-08-20 MED ORDER — LOSARTAN POTASSIUM-HCTZ 100-25 MG PO TABS: 1.0000 | ORAL_TABLET | Freq: Every day | ORAL | Status: DC

## 2015-08-20 MED ORDER — HYDROCODONE-ACETAMINOPHEN 7.5-325 MG PO TABS: 1.0000 | ORAL_TABLET | Freq: Three times a day (TID) | ORAL | Status: DC | PRN

## 2015-08-20 NOTE — Assessment & Plan Note (Addendum)
Hypertension is well-controlled and below goal 140/90 with current regimen and no adverse side effects. Obtain basic metabolic profile to check electrolytes and kidney function. Denies symptoms of end organ damage or worse headache of life. Continue with low-sodium diet. Continue current dosage of olmesartan-hydrochlorothiazide. Continue to monitor blood pressure at home. Follow up in 6 months.

## 2015-08-20 NOTE — Assessment & Plan Note (Signed)
Lumbar radiculitis well managed with current dosage of hydrocodone-acetaminophen and home exercise therapy. Currently awaiting admission to pain management. Denies constipation. [place] controlled substance database reviewed with no irregularities. Continue current dosage of hydrocodone-acetaminophen.

## 2015-08-20 NOTE — Progress Notes (Signed)
Subjective:    Patient ID: Alex Hogan, male    DOB: July 19, 1960, 55 y.o.   MRN: [id number]  Chief Complaint  Patient presents with  . Hypertension  . Back Pain    HPI:  Alex Hogan is a 55 y.o. male who  has a past medical history of Spinal stenosis, lumbar region, without neurogenic claudication; Lumbosacral neuritis; Heart murmur; Hypertension; Lumbosacral radiculitis; and Substance abuse. and presents today For a follow-up office visit.  1.) Hypertension: Currently maintained on Benicar HCT. Reports taking the medication as prescribed and denies adverse side effects. Blood pressures have been below goal 140/90 at home. Denies symptoms of end organ damage or worse headache of life. Working on following a low-sodium diet.  BP Readings from Last 3 Encounters:  08/20/15 122/78  07/19/15 120/82  06/20/15 162/92   2.) Lumbar radiculitis - This is a chronic problem. Currently maintained hydrocodone-acetaminophen. Reports taking the medications as prescribed and denies adverse side effects. Symptoms are adequately controlled with the current medication regimen and he is able to function accordingly. Continues to await pain management referral.  Allergies  Allergen Reactions  . Reglan [Metoclopramide Hcl]      Current Outpatient Prescriptions on File Prior to Visit  Medication Sig Dispense Refill  . olmesartan-hydrochlorothiazide (BENICAR HCT) 40-25 MG tablet Take 1 tablet by mouth daily. 30 tablet 5  . sildenafil (VIAGRA) 100 MG tablet Take 1 tablet (100 mg total) by mouth daily as needed for erectile dysfunction. 30 tablet 2   No current facility-administered medications on file prior to visit.    Review of Systems  Constitutional: Negative for fever and chills.  Eyes:       Negative for changes in vision  Respiratory: Negative for cough, chest tightness and wheezing.   Cardiovascular: Negative for chest pain, palpitations and leg swelling.  Neurological:  Negative for dizziness, weakness and light-headedness.      Objective:    BP 122/78 mmHg  Pulse 60  Temp(Src) 98.2 F (36.8 C) (Oral)  Resp 20  Wt 242 lb (109.77 kg)  SpO2 98% Nursing note and vital signs reviewed.  Physical Exam  Constitutional: He is oriented to person, place, and time. He appears well-developed and well-nourished. No distress.  Cardiovascular: Normal rate, regular rhythm, normal heart sounds and intact distal pulses.   Pulmonary/Chest: Effort normal and breath sounds normal.  Musculoskeletal:  Low back - no obvious deformity, discoloration, or edema noted. Palpable tenderness elicited along right lumbar spine and paraspinal musculature. No muscle spasm palpated. Distal pulses and sensation are intact and appropriate. Range of motion is adequate in all directions. Straight leg raises negative. Faber's test is negative.   Neurological: He is alert and oriented to person, place, and time.  Skin: Skin is warm and dry.  Psychiatric: He has a normal mood and affect. His behavior is normal. Judgment and thought content normal.       Assessment & Plan:   Problem List Items Addressed This Visit      Cardiovascular and Mediastinum   Essential hypertension    Hypertension is well-controlled and below goal 140/90 with current regimen and no adverse side effects. Obtain basic metabolic profile to check electrolytes and kidney function. Denies symptoms of end organ damage or worse headache of life. Continue with low-sodium diet. Continue current dosage of olmesartan-hydrochlorothiazide. Continue to monitor blood pressure at home. Follow up in 6 months.      Relevant Orders   Basic Metabolic Panel (BMET)  Nervous and Auditory   Lumbar radiculitis - Primary    Lumbar radiculitis well managed with current dosage of hydrocodone-acetaminophen and home exercise therapy. Currently awaiting admission to pain management. Denies constipation. [place] controlled substance  database reviewed with no irregularities. Continue current dosage of hydrocodone-acetaminophen.      Relevant Medications   HYDROcodone-acetaminophen (NORCO) 7.5-325 MG tablet       I am having Alex Hogan maintain his sildenafil, olmesartan-hydrochlorothiazide, and HYDROcodone-acetaminophen.   Meds ordered this encounter  Medications  . HYDROcodone-acetaminophen (NORCO) 7.5-325 MG tablet    Sig: Take 1 tablet by mouth every 8 (eight) hours as needed for moderate pain.    Dispense:  90 tablet    Refill:  0    Order Specific Question:  Supervising Provider    Answer:  Pricilla Holm A [id number]     Follow-up: Return in about 1 month (around 09/20/2015), or if symptoms worsen or fail to improve.  Mauricio Po, FNP

## 2015-08-20 NOTE — Patient Instructions (Signed)
Thank you for choosing [facility].  Summary/Instructions:  Please continue to take your medications as prescribed.  Continue to consume a low sodium diet.   Your prescription(s) have been submitted to your pharmacy or been printed and provided for you. Please take as directed and contact our office if you believe you are having problem(s) with the medication(s) or have any questions.  Please stop by the lab on the lower level of the building for your blood work. Your results will be released to [place] (or called to you) after review, usually within 72 hours after test completion. If any changes need to be made, you will be notified at that same time.  1. The lab is open from 7:30am to 5:30 pm Monday-Friday  2. No appointment is necessary  3. Fasting (if needed) is 6-8 hours after food and drink; black coffee  and water are okay   If your symptoms worsen or fail to improve, please contact our office for further instruction, or in case of emergency go directly to the emergency room at the closest medical facility.

## 2015-08-20 NOTE — Telephone Encounter (Signed)
Please advise 

## 2015-08-20 NOTE — Telephone Encounter (Signed)
Medication sent to pharmacy  

## 2015-08-20 NOTE — Telephone Encounter (Signed)
[facility] called and olmesartan-hydrochlorothiazide (BENICAR HCT) 40-25 MG tablet is not covered under insurance can we get that switched to Losartan. Please follow up thanks.

## 2015-08-20 NOTE — Progress Notes (Signed)
Pre visit review using our clinic review tool, if applicable. No additional management support is needed unless otherwise documented below in the visit note. 

## 2015-09-10 ENCOUNTER — Ambulatory Visit (AMBULATORY_SURGERY_CENTER): Payer: Self-pay | Admitting: *Deleted

## 2015-09-10 ENCOUNTER — Encounter (INDEPENDENT_AMBULATORY_CARE_PROVIDER_SITE_OTHER): Payer: Self-pay

## 2015-09-10 VITALS — Ht 75.0 in | Wt 248.8 lb

## 2015-09-10 DIAGNOSIS — Z1211 Encounter for screening for malignant neoplasm of colon: Secondary | ICD-10-CM

## 2015-09-10 MED ORDER — NA SULFATE-K SULFATE-MG SULF 17.5-3.13-1.6 GM/177ML PO SOLN: ORAL | 0 refills | Status: DC

## 2015-09-10 NOTE — Progress Notes (Signed)
No egg or soy allergy  No anesthesia or intubation problems per pt  No diet medications taken   

## 2015-09-20 ENCOUNTER — Encounter: Payer: Self-pay | Admitting: Family

## 2015-09-20 ENCOUNTER — Ambulatory Visit (INDEPENDENT_AMBULATORY_CARE_PROVIDER_SITE_OTHER): Payer: Medicare Other | Admitting: Family

## 2015-09-20 VITALS — BP 112/70 | HR 55 | Temp 98.0°F | Resp 16 | Ht 75.0 in | Wt 246.0 lb

## 2015-09-20 DIAGNOSIS — K649 Unspecified hemorrhoids: Secondary | ICD-10-CM

## 2015-09-20 DIAGNOSIS — M5416 Radiculopathy, lumbar region: Secondary | ICD-10-CM

## 2015-09-20 DIAGNOSIS — N529 Male erectile dysfunction, unspecified: Secondary | ICD-10-CM

## 2015-09-20 DIAGNOSIS — Z23 Encounter for immunization: Secondary | ICD-10-CM | POA: Diagnosis not present

## 2015-09-20 MED ORDER — HYDROCORTISONE 2.5 % RE CREA: 1.0000 "application " | TOPICAL_CREAM | Freq: Two times a day (BID) | RECTAL | 0 refills | Status: DC

## 2015-09-20 MED ORDER — HYDROCODONE-ACETAMINOPHEN 7.5-325 MG PO TABS: 1.0000 | ORAL_TABLET | Freq: Three times a day (TID) | ORAL | 0 refills | Status: DC | PRN

## 2015-09-20 NOTE — Assessment & Plan Note (Signed)
Symptoms consistent with hemorrhoids. Patient declines evaluation today. Start hydrocortisone rectal cream. Encouraged good rectal hygiene. Avoid constipation. Speak with gastroenterology to schedule possible appointment for hemorrhoidectomy.

## 2015-09-20 NOTE — Assessment & Plan Note (Signed)
Continues to experience erectile dysfunction that has been refractory to sildenafil. Refer to urology for further assessment and treatment.

## 2015-09-20 NOTE — Assessment & Plan Note (Signed)
Continues to experience lumbar radiculitis and pain is managed with hydrocodone-acetaminophen. Physical exam remains unchanged. Pain levels are 5/10 and able to function on a day-to-day basis. Currently awaiting referral to pain medicine. Continue current dosage of hydrocodone-acetaminophen. [place] controlled substance database reviewed with no irregularities. Due for UDS at next office visit.

## 2015-09-20 NOTE — Patient Instructions (Signed)
Thank you for choosing [facility].  Summary/Instructions:  Please continue to take her medications as prescribed.  Please start the hydrocortisone cream as needed for hemorrhoid relief.  They will call with your referral to urology.  Continue to avoid constipation and avoid excessive wiping. Consider using baby wipes or hemorrhoid wipes that have witch hazel in them.  Your prescription(s) have been submitted to your pharmacy or been printed and provided for you. Please take as directed and contact our office if you believe you are having problem(s) with the medication(s) or have any questions.  Referrals have been made during this visit. You should expect to hear back from our schedulers in about 7-10 days in regards to establishing an appointment with the specialists we discussed.   If your symptoms worsen or fail to improve, please contact our office for further instruction, or in case of emergency go directly to the emergency room at the closest medical facility.   Hemorrhoids Hemorrhoids are swollen veins around the rectum or anus. There are two types of hemorrhoids:   Internal hemorrhoids. These occur in the veins just inside the rectum. They may poke through to the outside and become irritated and painful.  External hemorrhoids. These occur in the veins outside the anus and can be felt as a painful swelling or hard lump near the anus. CAUSES  Pregnancy.   Obesity.   Constipation or diarrhea.   Straining to have a bowel movement.   Sitting for long periods on the toilet.  Heavy lifting or other activity that caused you to strain.  Anal intercourse. SYMPTOMS   Pain.   Anal itching or irritation.   Rectal bleeding.   Fecal leakage.   Anal swelling.   One or more lumps around the anus.  DIAGNOSIS  Your caregiver may be able to diagnose hemorrhoids by visual examination. Other examinations or tests that may be performed include:   Examination  of the rectal area with a gloved hand (digital rectal exam).   Examination of anal canal using a small tube (scope).   A blood test if you have lost a significant amount of blood.  A test to look inside the colon (sigmoidoscopy or colonoscopy). TREATMENT Most hemorrhoids can be treated at home. However, if symptoms do not seem to be getting better or if you have a lot of rectal bleeding, your caregiver may perform a procedure to help make the hemorrhoids get smaller or remove them completely. Possible treatments include:   Placing a rubber band at the base of the hemorrhoid to cut off the circulation (rubber band ligation).   Injecting a chemical to shrink the hemorrhoid (sclerotherapy).   Using a tool to burn the hemorrhoid (infrared light therapy).   Surgically removing the hemorrhoid (hemorrhoidectomy).   Stapling the hemorrhoid to block blood flow to the tissue (hemorrhoid stapling).  HOME CARE INSTRUCTIONS   Eat foods with fiber, such as whole grains, beans, nuts, fruits, and vegetables. Ask your doctor about taking products with added fiber in them (fibersupplements).  Increase fluid intake. Drink enough water and fluids to keep your urine clear or pale yellow.   Exercise regularly.   Go to the bathroom when you have the urge to have a bowel movement. Do not wait.   Avoid straining to have bowel movements.   Keep the anal area dry and clean. Use wet toilet paper or moist towelettes after a bowel movement.   Medicated creams and suppositories may be used or applied as directed.  Only take over-the-counter or prescription medicines as directed by your caregiver.   Take warm sitz baths for 15-20 minutes, 3-4 times a day to ease pain and discomfort.   Place ice packs on the hemorrhoids if they are tender and swollen. Using ice packs between sitz baths may be helpful.   Put ice in a plastic bag.   Place a towel between your skin and the bag.   Leave  the ice on for 15-20 minutes, 3-4 times a day.   Do not use a donut-shaped pillow or sit on the toilet for long periods. This increases blood pooling and pain.  SEEK MEDICAL CARE IF:  You have increasing pain and swelling that is not controlled by treatment or medicine.  You have uncontrolled bleeding.  You have difficulty or you are unable to have a bowel movement.  You have pain or inflammation outside the area of the hemorrhoids. MAKE SURE YOU:  Understand these instructions.  Will watch your condition.  Will get help right away if you are not doing well or get worse.   This information is not intended to replace advice given to you by your health care provider. Make sure you discuss any questions you have with your health care provider.   Document Released: 01/18/2000 Document Revised: 01/07/2012 Document Reviewed: 11/25/2011 Elsevier Interactive Patient Education [employer].
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
## 2015-09-24 ENCOUNTER — Encounter: Payer: Self-pay | Admitting: Internal Medicine

## 2015-09-24 ENCOUNTER — Ambulatory Visit (AMBULATORY_SURGERY_CENTER): Payer: Medicare Other | Admitting: Internal Medicine

## 2015-09-24 VITALS — BP 119/64 | HR 48 | Temp 97.5°F | Resp 17 | Ht 75.0 in | Wt 248.0 lb

## 2015-09-24 DIAGNOSIS — Z1211 Encounter for screening for malignant neoplasm of colon: Secondary | ICD-10-CM | POA: Diagnosis not present

## 2015-09-24 DIAGNOSIS — D123 Benign neoplasm of transverse colon: Secondary | ICD-10-CM

## 2015-09-24 MED ORDER — SODIUM CHLORIDE 0.9 % IV SOLN: 500.0000 mL | INTRAVENOUS | Status: DC

## 2015-09-24 NOTE — Patient Instructions (Signed)
YOU HAD AN ENDOSCOPIC PROCEDURE TODAY AT THE [facility]:   Refer to the procedure report that was given to you for any specific questions about what was found during the examination.  If the procedure report does not answer your questions, please call your gastroenterologist to clarify.  If you requested that your care partner not be given the details of your procedure findings, then the procedure report has been included in a sealed envelope for you to review at your convenience later.  YOU SHOULD EXPECT: Some feelings of bloating in the abdomen. Passage of more gas than usual.  Walking can help get rid of the air that was put into your GI tract during the procedure and reduce the bloating. If you had a lower endoscopy (such as a colonoscopy or flexible sigmoidoscopy) you may notice spotting of blood in your stool or on the toilet paper. If you underwent a bowel prep for your procedure, you may not have a normal bowel movement for a few days.  Please Note:  You might notice some irritation and congestion in your nose or some drainage.  This is from the oxygen used during your procedure.  There is no need for concern and it should clear up in a day or so.  SYMPTOMS TO REPORT IMMEDIATELY:   Following lower endoscopy (colonoscopy or flexible sigmoidoscopy):  Excessive amounts of blood in the stool  Significant tenderness or worsening of abdominal pains  Swelling of the abdomen that is new, acute  Fever of 100F or higher    For urgent or emergent issues, a gastroenterologist can be reached at any hour by calling [phone].   DIET:  We do recommend a small meal at first, but then you may proceed to your regular diet.  Drink plenty of fluids but you should avoid alcoholic beverages for 24 hours.  ACTIVITY:  You should plan to take it easy for the rest of today and you should NOT DRIVE or use heavy machinery until tomorrow (because of the sedation medicines used during the test).     FOLLOW UP: Our staff will call the number listed on your records the next business day following your procedure to check on you and address any questions or concerns that you may have regarding the information given to you following your procedure. If we do not reach you, we will leave a message.  However, if you are feeling well and you are not experiencing any problems, there is no need to return our call.  We will assume that you have returned to your regular daily activities without incident.  If any biopsies were taken you will be contacted by phone or by letter within the next 1-3 weeks.  Please call us at [phone] if you have not heard about the biopsies in 3 weeks.    SIGNATURES/CONFIDENTIALITY: You and/or your care partner have signed paperwork which will be entered into your electronic medical record.  These signatures attest to the fact that that the information above on your After Visit Summary has been reviewed and is understood.  Full responsibility of the confidentiality of this discharge information lies with you and/or your care-partner.   Resume medications. Information given on polyps. 

## 2015-09-24 NOTE — Op Note (Signed)
[facility] Patient Name: Alex Hogan Procedure Date: 09/24/2015 12:50 PM MRN: [id number] Endoscopist: Docia Chuck. Henrene Pastor , MD Age: 55 Referring MD:  Date of Birth: [birth year] Gender: Male Account #: [id number] Procedure:                Colonoscopywith cold snare polypectomy x 1 Indications:              Screening for colorectal malignant neoplasm Medicines:                Monitored Anesthesia Care Procedure:                Pre-Anesthesia Assessment:                           - Prior to the procedure, a History and Physical                            was performed, and patient medications and                            allergies were reviewed. The patient's tolerance of                            previous anesthesia was also reviewed. The risks                            and benefits of the procedure and the sedation                            options and risks were discussed with the patient.                            All questions were answered, and informed consent                            was obtained. Prior Anticoagulants: The patient has                            taken no previous anticoagulant or antiplatelet                            agents. ASA Grade Assessment: II - A patient with                            mild systemic disease. After reviewing the risks                            and benefits, the patient was deemed in                            satisfactory condition to undergo the procedure.                           After obtaining informed consent, the colonoscope  was passed under direct vision. Throughout the                            procedure, the patient's blood pressure, pulse, and                            oxygen saturations were monitored continuously. The                            Model CF-HQ190L [id number]) scope was introduced                            through the anus and advanced to the the cecum,                             identified by appendiceal orifice and ileocecal                            valve. The ileocecal valve, appendiceal orifice,                            and rectum were photographed. The quality of the                            bowel preparation was excellent. The colonoscopy                            was performed without difficulty. The patient                            tolerated the procedure well. The bowel preparation                            used was SUPREP. Scope In: 1:00:28 PM Scope Out: 1:14:02 PM Scope Withdrawal Time: 0 hours 10 minutes 13 seconds  Total Procedure Duration: 0 hours 13 minutes 34 seconds  Findings:                 A 4 mm polyp was found in the transverse colon. The                            polyp was removed with a cold snare. Resection and                            retrieval were complete.                           The exam was otherwise without abnormality on                            direct and retroflexion views. Complications:            No immediate complications. Estimated blood loss:  None. Estimated Blood Loss:     Estimated blood loss: none. Impression:               - One 4 mm polyp in the transverse colon, removed                            with a cold snare. Resected and retrieved.                           - The examination was otherwise normal on direct                            and retroflexion views. Recommendation:           - Repeat colonoscopy in 5-10 years for                            surveillance, based on pathology results.                           - Patient has a contact number available for                            emergencies. The signs and symptoms of potential                            delayed complications were discussed with the                            patient. Return to normal activities tomorrow.                            Written discharge instructions were provided to the                             patient.                           - Resume previous diet.                           - Continue present medications.                           - Await pathology results. Docia Chuck. Henrene Pastor, MD 09/24/2015 1:18:33 PM This report has been signed electronically.

## 2015-09-24 NOTE — Progress Notes (Signed)
Called to room to assist during endoscopic procedure.  Patient ID and intended procedure confirmed with present staff. Received instructions for my participation in the procedure from the performing physician.  

## 2015-09-24 NOTE — Progress Notes (Signed)
A/ox3 pleased with MAC, report to Sheila RN 

## 2015-09-25 ENCOUNTER — Telehealth: Payer: Self-pay | Admitting: *Deleted

## 2015-09-25 NOTE — Telephone Encounter (Signed)
  Follow up Call-  Call back number 09/24/2015  Post procedure Call Back phone  # [phone]  Permission to leave phone message Yes  Some recent data might be hidden     Patient questions:  Do you have a fever, pain , or abdominal swelling? No. Pain Score  0 *  Have you tolerated food without any problems? Yes.    Have you been able to return to your normal activities? Yes.    Do you have any questions about your discharge instructions: Diet   No. Medications  No. Follow up visit  No.  Do you have questions or concerns about your Care? No.  Actions: * If pain score is 4 or above: No action needed, pain <4.

## 2015-10-01 ENCOUNTER — Encounter: Payer: Self-pay | Admitting: Internal Medicine

## 2015-10-22 ENCOUNTER — Encounter: Payer: Self-pay | Admitting: Family

## 2015-10-22 ENCOUNTER — Ambulatory Visit (INDEPENDENT_AMBULATORY_CARE_PROVIDER_SITE_OTHER): Payer: Medicare Other | Admitting: Family

## 2015-10-22 VITALS — BP 132/80 | HR 65 | Temp 98.0°F | Resp 16 | Ht 75.0 in | Wt 244.0 lb

## 2015-10-22 DIAGNOSIS — Z23 Encounter for immunization: Secondary | ICD-10-CM | POA: Diagnosis not present

## 2015-10-22 DIAGNOSIS — M5416 Radiculopathy, lumbar region: Secondary | ICD-10-CM

## 2015-10-22 DIAGNOSIS — I10 Essential (primary) hypertension: Secondary | ICD-10-CM

## 2015-10-22 MED ORDER — HYDROCODONE-ACETAMINOPHEN 7.5-325 MG PO TABS: 1.0000 | ORAL_TABLET | Freq: Three times a day (TID) | ORAL | 0 refills | Status: DC | PRN

## 2015-10-22 NOTE — Progress Notes (Signed)
Subjective:    Patient ID: Alex Hogan, male    DOB: 1960/12/09, 55 y.o.   MRN: [id number]  Chief Complaint  Patient presents with  . Medication Refill    pain med refill    HPI:  Alex Hogan is a 55 y.o. male who  has a past medical history of Blood transfusion without reported diagnosis; Heart murmur; Hypertension; Lumbosacral neuritis; Lumbosacral radiculitis; Spinal stenosis, lumbar region, without neurogenic claudication; and Substance abuse. and presents today for a follow up office visit.   1.) Lumbar radiculitis - This is a chronic problem. Currently maintained on hydrocodone-acetaminophen. Reports taking the medication as prescribed and denies adverse side effects or constipation. Able to complete his daily tasks with a managable pain level of about 3-4/10. Notes that he has been in the gym recently and his back is feeling good. Constant pains are also improving. t  2.) Hypertension - Currently maintained on losartan-hydrochlorothiazide. Reports taking the medication as prescribed and denies adverse side effects or hypotensive readings. Blood pressure at home is below goal 140/90. Denies worse headache of life or symptoms of end organ damage. Continues to follow low-sodium diet. Endorses this is the best his blood pressure has been and best he has felt.   BP Readings from Last 3 Encounters:  10/22/15 132/80  09/24/15 119/64  09/20/15 112/70     Allergies  Allergen Reactions  . Reglan [Metoclopramide Hcl]     vomiting    Current Outpatient Prescriptions on File Prior to Visit  Medication Sig Dispense Refill  . hydrocortisone (ANUSOL-HC) 2.5 % rectal cream Place 1 application rectally 2 (two) times daily. 30 g 0  . losartan-hydrochlorothiazide (HYZAAR) 100-25 MG tablet Take 1 tablet by mouth daily. 30 tablet 5  . Na Sulfate-K Sulfate-Mg Sulf (SUPREP BOWEL PREP KIT) 17.5-3.13-1.6 GM/180ML SOLN Suprep as directed, no substitutions 354 mL 0  . sildenafil  (VIAGRA) 100 MG tablet Take 1 tablet (100 mg total) by mouth daily as needed for erectile dysfunction. 30 tablet 2   Current Facility-Administered Medications on File Prior to Visit  Medication Dose Route Frequency Provider Last Rate Last Dose  . 0.9 %  sodium chloride infusion  500 mL Intravenous Continuous Irene Shipper, MD         Review of Systems  Constitutional: Negative for chills and fever.  Eyes:       Negative for changes in vision  Respiratory: Negative for cough, chest tightness and wheezing.   Cardiovascular: Negative for chest pain, palpitations and leg swelling.  Musculoskeletal: Positive for back pain.  Neurological: Negative for dizziness, weakness and light-headedness.      Objective:    BP 132/80 (BP Location: Left Arm, Patient Position: Sitting, Cuff Size: Large)   Pulse 65   Temp 98 F (36.7 C) (Oral)   Resp 16   Ht '6\' 3"'  (1.905 m)   Wt 244 lb (110.7 kg)   SpO2 94%   BMI 30.50 kg/m  Nursing note and vital signs reviewed.  Physical Exam  Constitutional: He is oriented to person, place, and time. He appears well-developed and well-nourished. No distress.  Cardiovascular: Normal rate, regular rhythm, normal heart sounds and intact distal pulses.   Pulmonary/Chest: Effort normal and breath sounds normal.  Musculoskeletal:  Low back - no obvious deformity, discoloration, or edema noted. Palpable tenderness elicited along right lumbar spine and paraspinal musculature. No muscle spasm palpated. Distal pulses and sensation are intact and appropriate. Range of motion is adequate in all  directions. Straight leg raises negative. Faber's test is negative.    Neurological: He is alert and oriented to person, place, and time.  Skin: Skin is warm and dry.  Psychiatric: He has a normal mood and affect. His behavior is normal. Judgment and thought content normal.       Assessment & Plan:   Problem List Items Addressed This Visit      Cardiovascular and Mediastinum     Essential hypertension    Blood pressure remains adequately controlled current regimen and no adverse side effects, hypotensive readings or symptoms of end organ damage. Continue current dosage of losartan-hydrochlorothiazide. Continue to monitor blood pressure at home and follow low-sodium diet.        Nervous and Auditory   Lumbar radiculitis - Primary    Back pain significantly improved with additional exercises being performed at the gym. Notes pain is decreased to 3 out of 10 on average with current regimen. Discussed possibility of decreasing hydrocodone-acetaminophen at next office visit. Continue nonpharmacological therapies and exercise. [place] controlled substance database reviewed with no irregularities. Continue current dosage of hydrocodone-acetaminophen.      Relevant Medications   HYDROcodone-acetaminophen (NORCO) 7.5-325 MG tablet    Other Visit Diagnoses    Encounter for immunization       Relevant Orders   Flu Vaccine QUAD 36+ mos IM (Completed)       I am having Mr. Shreiner maintain his sildenafil, losartan-hydrochlorothiazide, Na Sulfate-K Sulfate-Mg Sulf, hydrocortisone, and HYDROcodone-acetaminophen. We will continue to administer sodium chloride.   Meds ordered this encounter  Medications  . HYDROcodone-acetaminophen (NORCO) 7.5-325 MG tablet    Sig: Take 1 tablet by mouth every 8 (eight) hours as needed for moderate pain.    Dispense:  90 tablet    Refill:  0    Order Specific Question:   Supervising Provider    Answer:   Pricilla Holm A [[id number]]     Follow-up: Return in about 1 month (around 11/21/2015), or if symptoms worsen or fail to improve.  Mauricio Po, FNP

## 2015-10-22 NOTE — Assessment & Plan Note (Signed)
Back pain significantly improved with additional exercises being performed at the gym. Notes pain is decreased to 3 out of 10 on average with current regimen. Discussed possibility of decreasing hydrocodone-acetaminophen at next office visit. Continue nonpharmacological therapies and exercise. [place] controlled substance database reviewed with no irregularities. Continue current dosage of hydrocodone-acetaminophen.

## 2015-10-22 NOTE — Assessment & Plan Note (Signed)
Blood pressure remains adequately controlled current regimen and no adverse side effects, hypotensive readings or symptoms of end organ damage. Continue current dosage of losartan-hydrochlorothiazide. Continue to monitor blood pressure at home and follow low-sodium diet.

## 2015-10-22 NOTE — Patient Instructions (Signed)
Thank you for choosing [facility].  SUMMARY AND INSTRUCTIONS:  Continue to work out at [employer].  Let's plan on decreasing pain medication at next office visit.  Medication:  Please continue take your medications as prescribed.   Your prescription(s) have been submitted to your pharmacy or been printed and provided for you. Please take as directed and contact our office if you believe you are having problem(s) with the medication(s) or have any questions.  Follow up:  If your symptoms worsen or fail to improve, please contact our office for further instruction, or in case of emergency go directly to the emergency room at the closest medical facility.

## 2015-10-30 ENCOUNTER — Telehealth: Payer: Self-pay | Admitting: *Deleted

## 2015-10-30 NOTE — Telephone Encounter (Signed)
Rec'd call pt is requesting his viagra to be re-ordered w/[employer]. Called @ [phone] gave ID # [id number] place order for viagra. Confirmation # [id number] will be deliver within 7-14 days...Alex Hogan

## 2015-11-09 ENCOUNTER — Telehealth: Payer: Self-pay | Admitting: Family

## 2015-11-09 NOTE — Telephone Encounter (Signed)
States drug company for viagra is requesting office to call for new order to be sent out.

## 2015-11-14 NOTE — Telephone Encounter (Signed)
Contacted [employer] again in regard b/c have not received order.   Found out that reorder had never been placed.  [employer] received damaged packaged back on 10/2. [employer] created report and is sending for supervisor override.  States will be her 7-14 business days.    New order number is: [id number]

## 2015-11-14 NOTE — Telephone Encounter (Signed)
Spoke with pt and [employer] about pts medication. Pt is requesting refill too early. Not due for refill until November.

## 2015-11-14 NOTE — Telephone Encounter (Signed)
Called to let pt know that viagra samples are up front until medication comes in.

## 2015-11-19 DIAGNOSIS — N5201 Erectile dysfunction due to arterial insufficiency: Secondary | ICD-10-CM | POA: Diagnosis not present

## 2015-11-19 DIAGNOSIS — E291 Testicular hypofunction: Secondary | ICD-10-CM | POA: Diagnosis not present

## 2015-11-21 ENCOUNTER — Telehealth: Payer: Self-pay | Admitting: *Deleted

## 2015-11-21 DIAGNOSIS — M5416 Radiculopathy, lumbar region: Secondary | ICD-10-CM

## 2015-11-21 MED ORDER — HYDROCODONE-ACETAMINOPHEN 7.5-325 MG PO TABS: 1.0000 | ORAL_TABLET | Freq: Three times a day (TID) | ORAL | 0 refills | Status: DC | PRN

## 2015-11-21 NOTE — Telephone Encounter (Signed)
Notified pt rx ready for pick-up.../lmb 

## 2015-11-21 NOTE — Telephone Encounter (Signed)
Medication printed for pick up.  

## 2015-11-21 NOTE — Telephone Encounter (Signed)
Rec'd call pt states he had to reschedule his appt to 10/30, because he have an emergency, death in the family and leaving tonight to go back to [place] will be gone for about a week. Wanting to see if Alex Hogan can rx enough pain med until appt on 10/30...Alex Hogan

## 2015-11-22 ENCOUNTER — Ambulatory Visit: Payer: Medicare Other | Admitting: Family

## 2015-12-03 ENCOUNTER — Ambulatory Visit: Payer: Medicare Other | Admitting: Family

## 2015-12-05 ENCOUNTER — Ambulatory Visit (INDEPENDENT_AMBULATORY_CARE_PROVIDER_SITE_OTHER): Payer: Medicare Other | Admitting: Family

## 2015-12-05 ENCOUNTER — Encounter: Payer: Self-pay | Admitting: Family

## 2015-12-05 DIAGNOSIS — M5416 Radiculopathy, lumbar region: Secondary | ICD-10-CM | POA: Diagnosis not present

## 2015-12-05 DIAGNOSIS — N529 Male erectile dysfunction, unspecified: Secondary | ICD-10-CM

## 2015-12-05 NOTE — Progress Notes (Signed)
Subjective:    Patient ID: Alex Hogan, male    DOB: 11/19/1960, 54 y.o.   MRN: [id number]  Chief Complaint  Patient presents with  . Follow-up    wants to let you know what happened at neurology appointment    HPI:  Alex Hogan is a 55 y.o. male who  has a past medical history of Blood transfusion without reported diagnosis; Heart murmur; Hypertension; Lumbosacral neuritis; Lumbosacral radiculitis; Spinal stenosis, lumbar region, without neurogenic claudication; and Substance abuse. and presents today for a follow up office visit.  1.) Erectile dysfunction - Currently maintained on Viagra. Reports taking the medication as prescribed with no adverse side effects. Symptoms remain labile. Has had follow up with Urology with blood work completed and follow up in 1 month to discuss possibility of injectable medications.  Instructed to continue current dosage of Viagra pending follow up office visit.    2.) Chronic back pain - Currently managed on hydrocodone-acetaminophen. Continues to make progress and work in the gym.  Reports taking the medication as prescribed and denies adverse side effects or constipation. Has occasional exacerbation of pain secondary to increased travel which has since improved. Continues to use non-pharmacological therapies including ice, moist heat, and creams as needed. Able to complete his activities of daily living well.    Allergies  Allergen Reactions  . Reglan [Metoclopramide Hcl]     vomiting      Outpatient Medications Prior to Visit  Medication Sig Dispense Refill  . HYDROcodone-acetaminophen (NORCO) 7.5-325 MG tablet Take 1 tablet by mouth every 8 (eight) hours as needed for moderate pain. 90 tablet 0  . hydrocortisone (ANUSOL-HC) 2.5 % rectal cream Place 1 application rectally 2 (two) times daily. 30 g 0  . losartan-hydrochlorothiazide (HYZAAR) 100-25 MG tablet Take 1 tablet by mouth daily. 30 tablet 5  . Na Sulfate-K Sulfate-Mg Sulf  (SUPREP BOWEL PREP KIT) 17.5-3.13-1.6 GM/180ML SOLN Suprep as directed, no substitutions 354 mL 0  . sildenafil (VIAGRA) 100 MG tablet Take 1 tablet (100 mg total) by mouth daily as needed for erectile dysfunction. 30 tablet 2  . 0.9 %  sodium chloride infusion      No facility-administered medications prior to visit.      Review of Systems  Constitutional: Negative for chills and fever.  Genitourinary:       Positive for erectile dysfunction.  Musculoskeletal:       Positive for back pain with radiculopathy  Neurological: Negative for weakness and numbness.      Objective:    BP 112/68 (BP Location: Left Arm, Patient Position: Sitting, Cuff Size: Large)   Pulse 66   Temp 98 F (36.7 C) (Oral)   Resp 16   Ht '6\' 3"'  (1.905 m)   Wt 242 lb (109.8 kg)   SpO2 98%   BMI 30.25 kg/m  Nursing note and vital signs reviewed.  Physical Exam  Constitutional: He is oriented to person, place, and time. He appears well-developed and well-nourished. No distress.  Cardiovascular: Normal rate, regular rhythm, normal heart sounds and intact distal pulses.   Pulmonary/Chest: Effort normal and breath sounds normal.  Musculoskeletal:  Low back - No obvious deformity, discoloration, or deformity. There is tenderness along the lumbar paraspinal musculature on the right. Straight leg raise positive for radiculopathy. Negative Fabers. Distal pulses and sensation are intact and approprate.   Neurological: He is alert and oriented to person, place, and time.  Skin: Skin is warm and dry.  Psychiatric: He  has a normal mood and affect. His behavior is normal. Judgment and thought content normal.       Assessment & Plan:   Problem List Items Addressed This Visit      Nervous and Auditory   Lumbar radiculitis    Back pain appears stable with occasional exacerbation. Recommend continued non-pharmacological therapy and will consider reducing hydrocodone-acetaminophen dosage at next office visit. [place]  Controlled Substance database reviewed with no significant irregularities. Continue current dosage of hydrocodone-acetaminophen.        Genitourinary   Erectile dysfunction    Erectile dysfunction appears stable with no significant changes and awaiting further follow up with urology for possible injectable medications. Continue current dosage of Viagra.       Other Visit Diagnoses   None.      I am having Alex Hogan maintain his sildenafil, losartan-hydrochlorothiazide, Na Sulfate-K Sulfate-Mg Sulf, hydrocortisone, and HYDROcodone-acetaminophen. We will stop administering sodium chloride.   Follow-up: Return in about 1 month (around 01/04/2016), or if symptoms worsen or fail to improve.  Mauricio Po, FNP

## 2015-12-05 NOTE — Patient Instructions (Signed)
Thank you for choosing [facility].  SUMMARY AND INSTRUCTIONS:  Continue follow up with urology.  Medication:  Please continue to take your medications as prescribed.   Your prescription(s) have been submitted to your pharmacy or been printed and provided for you. Please take as directed and contact our office if you believe you are having problem(s) with the medication(s) or have any questions.  Follow up:  If your symptoms worsen or fail to improve, please contact our office for further instruction, or in case of emergency go directly to the emergency room at the closest medical facility.

## 2015-12-05 NOTE — Assessment & Plan Note (Signed)
Back pain appears stable with occasional exacerbation. Recommend continued non-pharmacological therapy and will consider reducing hydrocodone-acetaminophen dosage at next office visit. [place] Controlled Substance database reviewed with no significant irregularities. Continue current dosage of hydrocodone-acetaminophen.

## 2015-12-05 NOTE — Assessment & Plan Note (Signed)
Erectile dysfunction appears stable with no significant changes and awaiting further follow up with urology for possible injectable medications. Continue current dosage of Viagra.

## 2015-12-21 ENCOUNTER — Telehealth: Payer: Self-pay

## 2015-12-21 DIAGNOSIS — M5416 Radiculopathy, lumbar region: Secondary | ICD-10-CM

## 2015-12-21 MED ORDER — HYDROCODONE-ACETAMINOPHEN 7.5-325 MG PO TABS: 1.0000 | ORAL_TABLET | Freq: Three times a day (TID) | ORAL | 0 refills | Status: DC | PRN

## 2015-12-21 NOTE — Telephone Encounter (Signed)
Pls advise if rf for hydrocodone is approved. Last wriiten 11/21/2015.

## 2015-12-21 NOTE — Telephone Encounter (Signed)
Patient has called back in regard?  °

## 2015-12-21 NOTE — Telephone Encounter (Signed)
Notified patient.

## 2015-12-21 NOTE — Telephone Encounter (Signed)
Printed and available for pickup.  

## 2016-01-04 ENCOUNTER — Ambulatory Visit: Payer: Medicare Other | Admitting: Family

## 2016-01-09 ENCOUNTER — Encounter: Payer: Medicare Other | Admitting: Family

## 2016-01-09 ENCOUNTER — Encounter: Payer: Self-pay | Admitting: Family

## 2016-01-09 NOTE — Progress Notes (Signed)
Error

## 2016-01-14 ENCOUNTER — Telehealth: Payer: Self-pay | Admitting: *Deleted

## 2016-01-14 DIAGNOSIS — N529 Male erectile dysfunction, unspecified: Secondary | ICD-10-CM

## 2016-01-14 MED ORDER — SILDENAFIL CITRATE 100 MG PO TABS: 100.0000 mg | ORAL_TABLET | Freq: Every day | ORAL | 0 refills | Status: DC | PRN

## 2016-01-14 NOTE — Telephone Encounter (Signed)
Left msg on triage needing medication "Viagra" order. Called [facility] gave info needing to place order. Msg states cqn be filled 01/15/16 and shipped out. Confirmation # [id number] pt will receive 7-10 business days. Notified pt order has been place and give confirmation #...Johny Chess

## 2016-01-16 ENCOUNTER — Other Ambulatory Visit: Payer: Self-pay | Admitting: Family

## 2016-01-21 ENCOUNTER — Telehealth: Payer: Self-pay | Admitting: *Deleted

## 2016-01-21 DIAGNOSIS — M5416 Radiculopathy, lumbar region: Secondary | ICD-10-CM

## 2016-01-21 MED ORDER — HYDROCODONE-ACETAMINOPHEN 7.5-325 MG PO TABS: 1.0000 | ORAL_TABLET | Freq: Three times a day (TID) | ORAL | 0 refills | Status: DC | PRN

## 2016-01-21 NOTE — Telephone Encounter (Signed)
Notified pt rx ready for pick-up.../lmb 

## 2016-01-21 NOTE — Telephone Encounter (Signed)
Medication available for pick up. 

## 2016-01-21 NOTE — Telephone Encounter (Signed)
Rec'd call pt requesting refill on Hydrocodone.../lmb 

## 2016-01-22 NOTE — Telephone Encounter (Signed)
Reviewed and fine, noted that there was change from 30 per month with previous provider and #90 per month starting May, was there a change in condition to justify increase? No early fills on database.

## 2016-02-13 ENCOUNTER — Telehealth: Payer: Self-pay

## 2016-02-13 ENCOUNTER — Other Ambulatory Visit: Payer: Self-pay | Admitting: *Deleted

## 2016-02-13 ENCOUNTER — Encounter: Payer: Self-pay | Admitting: Family

## 2016-02-13 ENCOUNTER — Ambulatory Visit (INDEPENDENT_AMBULATORY_CARE_PROVIDER_SITE_OTHER): Payer: Medicare Other | Admitting: Family

## 2016-02-13 DIAGNOSIS — N529 Male erectile dysfunction, unspecified: Secondary | ICD-10-CM

## 2016-02-13 DIAGNOSIS — Z79891 Long term (current) use of opiate analgesic: Secondary | ICD-10-CM | POA: Diagnosis not present

## 2016-02-13 DIAGNOSIS — M5416 Radiculopathy, lumbar region: Secondary | ICD-10-CM

## 2016-02-13 MED ORDER — LOSARTAN POTASSIUM-HCTZ 100-25 MG PO TABS: 1.0000 | ORAL_TABLET | Freq: Every day | ORAL | 0 refills | Status: DC

## 2016-02-13 MED ORDER — HYDROCODONE-ACETAMINOPHEN 7.5-325 MG PO TABS: 1.0000 | ORAL_TABLET | Freq: Three times a day (TID) | ORAL | 0 refills | Status: DC | PRN

## 2016-02-13 NOTE — Assessment & Plan Note (Signed)
Erectile dysfunction stable with current dosage of Viagra no adverse side effects or priapism. Completed paperwork for patient assistance program through [employer]. Continue current dosage of Viagra.

## 2016-02-13 NOTE — Assessment & Plan Note (Signed)
Continue current dosage of hydrocodone-acetaminophen. [place] controlled substance database reviewed with no irregularities. Urine drug screen obtained today. Reviewed with collaborating M.D. who is in agreement with treatment plan. Continue to monitor.

## 2016-02-13 NOTE — Progress Notes (Signed)
Subjective:    Patient ID: Alex Hogan, male    DOB: August 09, 1960, 56 y.o.   MRN: [id number]  Chief Complaint  Patient presents with  . Follow-up    paper work     HPI:  Alex Hogan is a 56 y.o. male who  has a past medical history of Blood transfusion without reported diagnosis; Heart murmur; Hypertension; Lumbosacral neuritis; Lumbosacral radiculitis; Spinal stenosis, lumbar region, without neurogenic claudication; and Substance abuse. and presents today for a follow up office visit.   1.) Lumbar radiculitis - Currently maintained on hydrocodone-acetaminophen and reports taking the medication as prescribed and denies adverse side effects or constipation. Notes that he has had some increased back spasms over the course of the couple of weeks which seemed to have improved. He continues to go to the gym and is able to complete light workouts.    2.) Erectile dysfunction - currently maintained on Viagra. Reports taking medication as prescribed and denies adverse side effects or priapism. Wishes to complete paperwork for [place] assistance program.   Allergies  Allergen Reactions  . Reglan [Metoclopramide Hcl]     vomiting      Outpatient Medications Prior to Visit  Medication Sig Dispense Refill  . hydrocortisone (ANUSOL-HC) 2.5 % rectal cream Place 1 application rectally 2 (two) times daily. 30 g 0  . losartan-hydrochlorothiazide (HYZAAR) 100-25 MG tablet TAKE 1 TABLET BY MOUTH DAILY. 30 tablet 2  . Na Sulfate-K Sulfate-Mg Sulf (SUPREP BOWEL PREP KIT) 17.5-3.13-1.6 GM/180ML SOLN Suprep as directed, no substitutions 354 mL 0  . sildenafil (VIAGRA) 100 MG tablet Take 1 tablet (100 mg total) by mouth daily as needed for erectile dysfunction. 60 tablet 0  . HYDROcodone-acetaminophen (NORCO) 7.5-325 MG tablet Take 1 tablet by mouth every 8 (eight) hours as needed for moderate pain. 90 tablet 0   No facility-administered medications prior to visit.     Review of Systems    Constitutional: Negative for chills and fever.  Musculoskeletal: Positive for back pain.  Neurological: Negative for weakness and numbness.      Objective:    BP 120/74 (BP Location: Left Arm, Patient Position: Sitting, Cuff Size: Large)   Pulse 63   Temp 98 F (36.7 C) (Oral)   Resp 16   Ht _0  (1.905 m)   Wt 223 lb (101.2 kg)   SpO2 97%   BMI 27.87 kg/m  Nursing note and vital signs reviewed.  Physical Exam  Constitutional: He is oriented to person, place, and time. He appears well-developed and well-nourished. No distress.  Cardiovascular: Normal rate, regular rhythm, normal heart sounds and intact distal pulses.   Pulmonary/Chest: Effort normal and breath sounds normal.  Musculoskeletal:  Low back - no obvious deformity, discoloration, or edema. Palpable tenderness along right lumbar paraspinal musculature. No spasms or crepitus noted. Range of motion is close to full in all directions. Distal pulses and sensation are intact and appropriate. Negative straight leg raise. Negative Faber's.    Neurological: He is alert and oriented to person, place, and time.  Skin: Skin is warm and dry.  Psychiatric: He has a normal mood and affect. His behavior is normal. Judgment and thought content normal.       Assessment & Plan:   Problem List Items Addressed This Visit      Nervous and Auditory   Lumbar radiculitis    Continue current dosage of hydrocodone-acetaminophen. [place] controlled substance database reviewed with no irregularities. Urine drug screen obtained today.  Reviewed with collaborating M.D. who is in agreement with treatment plan. Continue to monitor.      Relevant Medications   HYDROcodone-acetaminophen (NORCO) 7.5-325 MG tablet     Genitourinary   Erectile dysfunction    Erectile dysfunction stable with current dosage of Viagra no adverse side effects or priapism. Completed paperwork for patient assistance program through [employer]. Continue current  dosage of Viagra.          I am having Alex Hogan maintain his Na Sulfate-K Sulfate-Mg Sulf, hydrocortisone, sildenafil, losartan-hydrochlorothiazide, and HYDROcodone-acetaminophen.   Meds ordered this encounter  Medications  . HYDROcodone-acetaminophen (NORCO) 7.5-325 MG tablet    Sig: Take 1 tablet by mouth every 8 (eight) hours as needed for moderate pain.    Dispense:  90 tablet    Refill:  0    Fill on or after 02/20/16    Order Specific Question:   Supervising Provider    Answer:   Pricilla Holm A [[id number]]     Follow-up: Return in about 2 months (around 04/12/2016), or if symptoms worsen or fail to improve.  Alex Po, FNP

## 2016-02-13 NOTE — Patient Instructions (Signed)
Thank you for choosing [facility].  SUMMARY AND INSTRUCTIONS:  Medication:  Please continue to take your medications as prescribed.  We will fax your medication request to [facility].   Your prescription(s) have been submitted to your pharmacy or been printed and provided for you. Please take as directed and contact our office if you believe you are having problem(s) with the medication(s) or have any questions.   Follow up:  If your symptoms worsen or fail to improve, please contact our office for further instruction, or in case of emergency go directly to the emergency room at the closest medical facility.

## 2016-02-13 NOTE — Telephone Encounter (Signed)
[employer] renewal forms have been faxed.

## 2016-03-05 ENCOUNTER — Telehealth: Payer: Self-pay | Admitting: Family

## 2016-03-05 NOTE — Telephone Encounter (Signed)
Noted  

## 2016-03-05 NOTE — Telephone Encounter (Signed)
States Alex Hogan is sending application back to our office that patient needs to complete.  Patient states once we get this to give him a call to come in to complete.

## 2016-03-17 ENCOUNTER — Ambulatory Visit: Payer: Medicare Other | Admitting: Family

## 2016-03-17 DIAGNOSIS — J342 Deviated nasal septum: Secondary | ICD-10-CM | POA: Diagnosis not present

## 2016-03-17 DIAGNOSIS — J343 Hypertrophy of nasal turbinates: Secondary | ICD-10-CM | POA: Diagnosis not present

## 2016-03-18 DIAGNOSIS — Z131 Encounter for screening for diabetes mellitus: Secondary | ICD-10-CM | POA: Diagnosis not present

## 2016-03-18 DIAGNOSIS — I10 Essential (primary) hypertension: Secondary | ICD-10-CM | POA: Diagnosis not present

## 2016-03-18 DIAGNOSIS — B354 Tinea corporis: Secondary | ICD-10-CM | POA: Diagnosis not present

## 2016-03-18 DIAGNOSIS — Z125 Encounter for screening for malignant neoplasm of prostate: Secondary | ICD-10-CM | POA: Diagnosis not present

## 2016-03-18 DIAGNOSIS — Z13220 Encounter for screening for lipoid disorders: Secondary | ICD-10-CM | POA: Diagnosis not present

## 2016-03-18 DIAGNOSIS — K648 Other hemorrhoids: Secondary | ICD-10-CM | POA: Diagnosis not present

## 2016-03-18 DIAGNOSIS — M5136 Other intervertebral disc degeneration, lumbar region: Secondary | ICD-10-CM | POA: Diagnosis not present

## 2016-03-18 DIAGNOSIS — F5221 Male erectile disorder: Secondary | ICD-10-CM | POA: Diagnosis not present

## 2016-03-24 ENCOUNTER — Ambulatory Visit (INDEPENDENT_AMBULATORY_CARE_PROVIDER_SITE_OTHER): Payer: Medicare Other | Admitting: Family

## 2016-03-24 ENCOUNTER — Encounter: Payer: Self-pay | Admitting: Family

## 2016-03-24 ENCOUNTER — Other Ambulatory Visit: Payer: Self-pay | Admitting: *Deleted

## 2016-03-24 VITALS — BP 122/82 | HR 68 | Temp 97.8°F | Ht 75.0 in | Wt 239.0 lb

## 2016-03-24 DIAGNOSIS — M5416 Radiculopathy, lumbar region: Secondary | ICD-10-CM | POA: Diagnosis not present

## 2016-03-24 MED ORDER — HYDROCODONE-ACETAMINOPHEN 7.5-325 MG PO TABS: 1.0000 | ORAL_TABLET | Freq: Three times a day (TID) | ORAL | 0 refills | Status: DC | PRN

## 2016-03-24 NOTE — Progress Notes (Signed)
Pre visit review using our clinic review tool, if applicable. No additional management support is needed unless otherwise documented below in the visit note. 

## 2016-03-24 NOTE — Assessment & Plan Note (Signed)
Lumbar radiculitis appears adequately controlled current medication regimen and no adverse side effects or constipation. Most recent drug screen negative for prescribed medications or nonprescribed medications. Discussed with patient the importance of taking medications as prescribed. Urine drug screen at next office visit. [place] controlled substance database reviewed with no irregularities. Appears to have adequate function with medication regimen. Continue to monitor.

## 2016-03-24 NOTE — Patient Instructions (Addendum)
Thank you for choosing [facility].  SUMMARY AND INSTRUCTIONS:  Medication:  Please continue to take your medications as prescribed.   Your prescription(s) have been submitted to your pharmacy or been printed and provided for you. Please take as directed and contact our office if you believe you are having problem(s) with the medication(s) or have any questions.  Follow up:  If your symptoms worsen or fail to improve, please contact our office for further instruction, or in case of emergency go directly to the emergency room at the closest medical facility.     

## 2016-03-24 NOTE — Progress Notes (Addendum)
Subjective:    Patient ID: Alex Hogan, male    DOB: [birth year], 56 y.o.   MRN: [id number]  Chief Complaint  Patient presents with  . Medication Refill    HPI:  Alex Hogan is a 56 y.o. male who  has a past medical history of Blood transfusion without reported diagnosis; Heart murmur; Hypertension; Lumbosacral neuritis; Lumbosacral radiculitis; Spinal stenosis, lumbar region, without neurogenic claudication; and Substance abuse. and presents today for a follow up office visit.   1.) Lumbar radiculitis - Currently maintained hydrocodone-acetaminophen and reports taking the medication as prescribed and denies adverse side effects or constipation. He reports there is are a couple of days that he does take an extra pill every now and then. His most recent urine drug screen was negative for any medications including those that are prescribed. He continues to experience pain located in his lower back that he indicates is well managed with current medication regimen. He does continue to exercise on a regular basis.    Allergies  Allergen Reactions  . Reglan [Metoclopramide Hcl]     vomiting      Outpatient Medications Prior to Visit  Medication Sig Dispense Refill  . fluticasone (FLONASE) 50 MCG/ACT nasal spray     . hydrocortisone (ANUSOL-HC) 2.5 % rectal cream Place 1 application rectally 2 (two) times daily. 30 g 0  . losartan-hydrochlorothiazide (HYZAAR) 100-25 MG tablet Take 1 tablet by mouth daily. 90 tablet 0  . Na Sulfate-K Sulfate-Mg Sulf (SUPREP BOWEL PREP KIT) 17.5-3.13-1.6 GM/180ML SOLN Suprep as directed, no substitutions 354 mL 0  . sildenafil (VIAGRA) 100 MG tablet Take 1 tablet (100 mg total) by mouth daily as needed for erectile dysfunction. 60 tablet 0  . fluticasone (FLONASE) 50 MCG/ACT nasal spray 2 sprays by Each Nare route daily.    Marland Kitchen HYDROcodone-acetaminophen (NORCO) 7.5-325 MG tablet Take 1 tablet by mouth every 8 (eight) hours as needed for moderate  pain. 90 tablet 0   No facility-administered medications prior to visit.       Review of Systems  Constitutional: Negative for chills and fever.  Respiratory: Negative for chest tightness and shortness of breath.   Musculoskeletal: Positive for back pain.  Neurological: Negative for weakness and numbness.      Objective:    BP 122/82   Pulse 68   Temp 97.8 F (36.6 C)   Ht '6\' 3"'  (1.905 m)   Wt 239 lb (108.4 kg)   SpO2 98%   BMI 29.87 kg/m  Nursing note and vital signs reviewed.  Physical Exam  Constitutional: He is oriented to person, place, and time. He appears well-developed and well-nourished. No distress.  Cardiovascular: Normal rate, regular rhythm, normal heart sounds and intact distal pulses.   Pulmonary/Chest: Effort normal and breath sounds normal.  Musculoskeletal:  No changes from previous exam.   Neurological: He is alert and oriented to person, place, and time.  Skin: Skin is warm and dry.  Psychiatric: He has a normal mood and affect. His behavior is normal. Judgment and thought content normal.       Assessment & Plan:   Problem List Items Addressed This Visit      Nervous and Auditory   Lumbar radiculitis - Primary    Lumbar radiculitis appears adequately controlled current medication regimen and no adverse side effects or constipation. Most recent drug screen negative for prescribed medications or nonprescribed medications. Discussed with patient the importance of taking medications as prescribed. Urine drug screen at  next office visit. [place] controlled substance database reviewed with no irregularities. Appears to have adequate function with medication regimen. Continue to monitor.      Relevant Medications   HYDROcodone-acetaminophen (NORCO) 7.5-325 MG tablet       I am having Mr. Dancy maintain his Na Sulfate-K Sulfate-Mg Sulf, hydrocortisone, sildenafil, losartan-hydrochlorothiazide, fluticasone, and  HYDROcodone-acetaminophen.   Meds ordered this encounter  Medications  . HYDROcodone-acetaminophen (NORCO) 7.5-325 MG tablet    Sig: Take 1 tablet by mouth every 8 (eight) hours as needed for moderate pain.    Dispense:  90 tablet    Refill:  0    Order Specific Question:   Supervising Provider    Answer:   Pricilla Holm A [[id number]]     Follow-up: Return in about 1 month (around 04/21/2016), or if symptoms worsen or fail to improve.  Mauricio Po, FNP

## 2016-03-25 NOTE — Progress Notes (Signed)
Agree with repeat UDS at next visit and if continued to be negative start wean off controlled substance. Hoyt Koch, MD

## 2016-04-17 ENCOUNTER — Encounter: Payer: Self-pay | Admitting: Podiatry

## 2016-04-17 ENCOUNTER — Ambulatory Visit (INDEPENDENT_AMBULATORY_CARE_PROVIDER_SITE_OTHER): Payer: Medicare Other | Admitting: Podiatry

## 2016-04-17 DIAGNOSIS — J343 Hypertrophy of nasal turbinates: Secondary | ICD-10-CM | POA: Diagnosis not present

## 2016-04-17 DIAGNOSIS — L600 Ingrowing nail: Secondary | ICD-10-CM

## 2016-04-17 DIAGNOSIS — J342 Deviated nasal septum: Secondary | ICD-10-CM | POA: Diagnosis not present

## 2016-04-17 NOTE — Patient Instructions (Addendum)

## 2016-04-18 NOTE — Progress Notes (Signed)
Subjective:     Patient ID: Alex Hogan, male   DOB: 1960-02-09, 56 y.o.   MRN: [id number]  HPI patient presents stating his right big toenail lateral side is very tender and he's tried to trim it without relief and he cannot wear shoes comfortably. States been present for several months   Review of Systems  All other systems reviewed and are negative.      Objective:   Physical Exam  Constitutional: He is oriented to person, place, and time.  Cardiovascular: Intact distal pulses.   Musculoskeletal: Normal range of motion.  Neurological: He is oriented to person, place, and time.  Skin: Skin is warm.  Nursing note and vitals reviewed.  neurovascular status intact muscle strength adequate range of motion within normal limits with patient found have incurvated lateral border right hallux that's painful when pressed with distal redness but no active drainage noted. Patient's found have good digital perfusion well oriented 3     Assessment:     Ingrown toenail deformity right hallux lateral border with pain    Plan:     H&P condition reviewed and recommended correction. Explained procedure and risk and today I infiltrated the right hallux 60 mg I can Marcaine mixture remove the lateral border exposed matrix and applied phenol 3 applications 30 seconds followed by alcohol lavage and sterile dressing. Gave instructions on soaks and reappoint

## 2016-04-21 ENCOUNTER — Ambulatory Visit: Payer: Medicare Other | Admitting: Family

## 2016-04-21 DIAGNOSIS — J342 Deviated nasal septum: Secondary | ICD-10-CM | POA: Diagnosis not present

## 2016-04-21 DIAGNOSIS — J343 Hypertrophy of nasal turbinates: Secondary | ICD-10-CM | POA: Diagnosis not present

## 2016-04-22 ENCOUNTER — Other Ambulatory Visit: Payer: Self-pay | Admitting: Family

## 2016-04-22 ENCOUNTER — Ambulatory Visit: Payer: Medicare Other | Admitting: Family

## 2016-04-22 DIAGNOSIS — M5416 Radiculopathy, lumbar region: Secondary | ICD-10-CM

## 2016-04-22 MED ORDER — HYDROCODONE-ACETAMINOPHEN 7.5-325 MG PO TABS: 1.0000 | ORAL_TABLET | Freq: Three times a day (TID) | ORAL | 0 refills | Status: DC | PRN

## 2016-04-23 DIAGNOSIS — Z79891 Long term (current) use of opiate analgesic: Secondary | ICD-10-CM | POA: Diagnosis not present

## 2016-05-10 ENCOUNTER — Other Ambulatory Visit: Payer: Self-pay | Admitting: Family

## 2016-05-20 ENCOUNTER — Encounter: Payer: Self-pay | Admitting: Family

## 2016-05-20 ENCOUNTER — Ambulatory Visit (INDEPENDENT_AMBULATORY_CARE_PROVIDER_SITE_OTHER): Payer: Medicare Other | Admitting: Family

## 2016-05-20 VITALS — BP 136/80 | HR 56 | Temp 97.9°F | Resp 16 | Ht 75.0 in | Wt 231.8 lb

## 2016-05-20 DIAGNOSIS — M5416 Radiculopathy, lumbar region: Secondary | ICD-10-CM | POA: Diagnosis not present

## 2016-05-20 DIAGNOSIS — M7582 Other shoulder lesions, left shoulder: Secondary | ICD-10-CM | POA: Insufficient documentation

## 2016-05-20 MED ORDER — HYDROCODONE-ACETAMINOPHEN 7.5-325 MG PO TABS: 1.0000 | ORAL_TABLET | Freq: Three times a day (TID) | ORAL | 0 refills | Status: DC | PRN

## 2016-05-20 NOTE — Assessment & Plan Note (Signed)
Chronic lumbar radiculitis appears adequately managed with current medication regimen as patient is functionally able to go to the gym and work out. No adverse side effects or constipation. [place] controlled substance database reviewed with no irregularities. Controlled substance contract signed today. Continue current dosage of hydrocodone-acetaminophen.

## 2016-05-20 NOTE — Assessment & Plan Note (Signed)
This is a new problem with symptoms and exam being consistent with rotator cuff tendinopathy with concern for possible rotator cuff tear. In office injection of corticosteroid provided without complication and tolerated well. Treat conservatively with ice, home exercise therapy, and over-the-counter medications as needed for symptom relief and supportive care. If symptoms worsen or do not improve consider referral for physical therapy and possible further imaging.
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
## 2016-05-20 NOTE — Patient Instructions (Signed)
Thank you for choosing [facility].  SUMMARY AND INSTRUCTIONS:  Ice x 20 minutes every 2 hours and after activity.  Exercises daily.  Ibuprofen 3x daily for the next 5-7 days then as needed.  Take it easy with no heavy lifting for 24 hours.  If your symptoms worsen we will consider physical therapy or possible MRI.   CONTROLLED SUBSTANCES:  You are responsible for taking the medications as prescribed and maintaining the security of your medication. Early refills or loss of medication WILL NOT BE APPROVED/REFILLED.  You may be drug tested at any time. If you test positive for a medication / substance that is not prescribed, you will no longer be prescribed any controlled substances. We are more than happy to continue to provide primary care services.   Medication:  Your prescription(s) have been submitted to your pharmacy or been printed and provided for you. Please take as directed and contact our office if you believe you are having problem(s) with the medication(s) or have any questions.  Follow up:  If your symptoms worsen or fail to improve, please contact our office for further instruction, or in case of emergency go directly to the emergency room at the closest medical facility.    Shoulder Impingement Syndrome Rehab Ask your health care provider which exercises are safe for you. Do exercises exactly as told by your health care provider and adjust them as directed. It is normal to feel mild stretching, pulling, tightness, or discomfort as you do these exercises, but you should stop right away if you feel sudden pain or your pain gets worse.Do not begin these exercises until told by your health care provider. Stretching and range of motion exercise This exercise warms up your muscles and joints and improves the movement and flexibility of your shoulder. This exercise also helps to relieve pain and stiffness. Exercise A: Passive horizontal adduction   1. Sit or stand  and pull your left / right elbow across your chest, toward your other shoulder. Stop when you feel a gentle stretch in the back of your shoulder and upper arm.  Keep your arm at shoulder height.  Keep your arm as close to your body as you comfortably can. 2. Hold for __________ seconds. 3. Slowly return to the starting position. Repeat __________ times. Complete this exercise __________ times a day. Strengthening exercises These exercises build strength and endurance in your shoulder. Endurance is the ability to use your muscles for a long time, even after they get tired. Exercise B: External rotation, isometric  1. Stand or sit in a doorway, facing the door frame. 2. Bend your left / right elbow and place the back of your wrist against the door frame. Only your wrist should be touching the frame. Keep your upper arm at your side. 3. Gently press your wrist against the door frame, as if you are trying to push your arm away from your abdomen.  Avoid shrugging your shoulder while you press your hand against the door frame. Keep your shoulder blade tucked down toward the middle of your back. 4. Hold for __________ seconds. 5. Slowly release the tension, and relax your muscles completely before you do the exercise again. Repeat __________ times. Complete this exercise __________ times a day. Exercise C: Internal rotation, isometric   1. Stand or sit in a doorway, facing the door frame. 2. Bend your left / right elbow and place the inside of your wrist against the door frame. Only your wrist should be  touching the frame. Keep your upper arm at your side. 3. Gently press your wrist against the door frame, as if you are trying to push your arm toward your abdomen.  Avoid shrugging your shoulder while you press your hand against the door frame. Keep your shoulder blade tucked down toward the middle of your back. 4. Hold for __________ seconds. 5. Slowly release the tension, and relax your muscles  completely before you do the exercise again. Repeat __________ times. Complete this exercise __________ times a day. Exercise D: Scapular protraction, supine   1. Lie on your back on a firm surface. Hold a __________ weight in your left / right hand. 2. Raise your left / right arm straight into the air so your hand is directly above your shoulder joint. 3. Push the weight into the air so your shoulder lifts off of the surface that you are lying on. Do not move your head, neck, or back. 4. Hold for __________ seconds. 5. Slowly return to the starting position. Let your muscles relax completely before you repeat this exercise. Repeat __________ times. Complete this exercise __________ times a day. Exercise E: Scapular retraction   1. Sit in a stable chair without armrests, or stand. 2. Secure an exercise band to a stable object in front of you so the band is at shoulder height. 3. Hold one end of the exercise band in each hand. Your palms should face down. 4. Squeeze your shoulder blades together and move your elbows slightly behind you. Do not shrug your shoulders while you do this. 5. Hold for __________ seconds. 6. Slowly return to the starting position. Repeat __________ times. Complete this exercise __________ times a day. Exercise F: Shoulder extension   1. Sit in a stable chair without armrests, or stand. 2. Secure an exercise band to a stable object in front of you where the band is above shoulder height. 3. Hold one end of the exercise band in each hand. 4. Straighten your elbows and lift your hands up to shoulder height. 5. Squeeze your shoulder blades together and pull your hands down to the sides of your thighs. Stop when your hands are straight down by your sides. Do not let your hands go behind your body. 6. Hold for __________ seconds. 7. Slowly return to the starting position. Repeat __________ times. Complete this exercise __________ times a day. This information is not  intended to replace advice given to you by your health care provider. Make sure you discuss any questions you have with your health care provider. Document Released: 01/20/2005 Document Revised: 09/27/2015 Document Reviewed: 12/23/2014 Elsevier Interactive Patient Education  2017 [employer].

## 2016-06-04 ENCOUNTER — Telehealth: Payer: Self-pay | Admitting: *Deleted

## 2016-06-04 NOTE — Telephone Encounter (Signed)
Rec'd call pt is requesting his viagra to be re-ordered w/[facility]. Called @ [phone] gave ID # [id number] place order for viagra confirmation # [id number] will shipped in 5-7 days..Notified pt w/status...Alex Hogan

## 2016-06-11 NOTE — Telephone Encounter (Signed)
Patient called back.  Medication has arrived.  Viagra 100mg  1x30 Customer number [phone].  Have ready for pickup.  Will give packing slip to [facility].

## 2016-06-19 ENCOUNTER — Ambulatory Visit (INDEPENDENT_AMBULATORY_CARE_PROVIDER_SITE_OTHER): Payer: Medicare Other | Admitting: Family

## 2016-06-19 ENCOUNTER — Encounter: Payer: Self-pay | Admitting: Family

## 2016-06-19 VITALS — BP 126/78 | HR 65 | Temp 98.7°F | Resp 16 | Ht 75.0 in | Wt 235.0 lb

## 2016-06-19 DIAGNOSIS — M7582 Other shoulder lesions, left shoulder: Secondary | ICD-10-CM | POA: Diagnosis not present

## 2016-06-19 DIAGNOSIS — M5416 Radiculopathy, lumbar region: Secondary | ICD-10-CM

## 2016-06-19 MED ORDER — HYDROCODONE-ACETAMINOPHEN 7.5-325 MG PO TABS: 1.0000 | ORAL_TABLET | Freq: Three times a day (TID) | ORAL | 0 refills | Status: DC | PRN

## 2016-06-19 NOTE — Assessment & Plan Note (Signed)
Symptoms and exam remain consistent with rotator cuff tendinopathy or impingement. Refer for physical therapy. Obtain MRI given worsening symptoms with no improvement with conservative treatment or cortisone injection. Continue icing regimen and home exercise therapy. Follow-up pending physical therapy and imaging.

## 2016-06-19 NOTE — Patient Instructions (Signed)
Thank you for choosing [facility].  SUMMARY AND INSTRUCTIONS:  Continue to take your medication as prescribed.   They will call to schedule your appointment with physical therapy and for an MRI.   Continue with ice and home exercise therapy.   Medication:  Your prescription(s) have been submitted to your pharmacy or been printed and provided for you. Please take as directed and contact our office if you believe you are having problem(s) with the medication(s) or have any questions.   Follow up:  If your symptoms worsen or fail to improve, please contact our office for further instruction, or in case of emergency go directly to the emergency room at the closest medical facility.
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
## 2016-06-19 NOTE — Assessment & Plan Note (Signed)
Lumbar back pain appears adequately controlled with occasional exacerbation. Able to function appropriately and complete activities of daily living without problem. Continue current dosage of hydrocodone-acetaminophen. Encourage continue home exercise therapy and ice/heat. [place] controlled substance database reviewed with no irregularities.

## 2016-07-07 ENCOUNTER — Other Ambulatory Visit: Payer: Medicare Other

## 2016-07-09 ENCOUNTER — Ambulatory Visit: Payer: Medicare Other | Admitting: Physical Therapy

## 2016-07-16 ENCOUNTER — Other Ambulatory Visit: Payer: Self-pay | Admitting: Family

## 2016-07-16 ENCOUNTER — Ambulatory Visit
Admission: RE | Admit: 2016-07-16 | Discharge: 2016-07-16 | Disposition: A | Discharge status: Home / Self Care | Payer: Medicare Other | Source: Ambulatory Visit | Attending: Family | Admitting: Family

## 2016-07-16 DIAGNOSIS — M19012 Primary osteoarthritis, left shoulder: Secondary | ICD-10-CM | POA: Diagnosis not present

## 2016-07-16 DIAGNOSIS — M7582 Other shoulder lesions, left shoulder: Secondary | ICD-10-CM

## 2016-07-17 ENCOUNTER — Ambulatory Visit: Payer: Medicare Other | Admitting: Physical Therapy

## 2016-07-21 ENCOUNTER — Encounter: Payer: Self-pay | Admitting: Family

## 2016-07-21 ENCOUNTER — Ambulatory Visit (INDEPENDENT_AMBULATORY_CARE_PROVIDER_SITE_OTHER): Payer: Medicare Other | Admitting: Family

## 2016-07-21 DIAGNOSIS — M5416 Radiculopathy, lumbar region: Secondary | ICD-10-CM

## 2016-07-21 MED ORDER — HYDROCODONE-ACETAMINOPHEN 7.5-325 MG PO TABS: 1.0000 | ORAL_TABLET | Freq: Three times a day (TID) | ORAL | 0 refills | Status: DC | PRN

## 2016-07-21 NOTE — Patient Instructions (Signed)
Thank you for choosing [facility].  SUMMARY AND INSTRUCTIONS:  Continue to follow up with orthopedics for your shoulder.  Ice/moist heat x 20 minutes every 2 hours and as needed.  Continue with home exercise therapy.  Medication:  Please continue to take your medication as prescribed.   Follow up:  If your symptoms worsen or fail to improve, please contact our office for further instruction, or in case of emergency go directly to the emergency room at the closest medical facility.

## 2016-07-21 NOTE — Progress Notes (Signed)
Subjective:    Patient ID: Alex Hogan, male    DOB: 12-Mar-1960, 56 y.o.   MRN: [id number]  Chief Complaint  Patient presents with  . Medication Refill    pain med    HPI:  Alex Hogan is a 56 y.o. male who  has a past medical history of Blood transfusion without reported diagnosis; Heart murmur; Hypertension; Lumbosacral neuritis; Lumbosacral radiculitis; Spinal stenosis, lumbar region, without neurogenic claudication; and Substance abuse. and presents today for a follow up office visit.  Chronic pain management - Currently maintained on hydrocodone-acetaminophen. Reports taking the medication as prescribed and denies adverse side effects or constipation. Able to be functional with his current back pain and coimplete his activities of daily living and go to the gym. No trauma or injury. No recent exacerbation.   Allergies  Allergen Reactions  . Reglan [Metoclopramide Hcl]     vomiting      Outpatient Medications Prior to Visit  Medication Sig Dispense Refill  . fluticasone (FLONASE) 50 MCG/ACT nasal spray     . hydrocortisone (ANUSOL-HC) 2.5 % rectal cream Place 1 application rectally 2 (two) times daily. 30 g 0  . losartan-hydrochlorothiazide (HYZAAR) 100-25 MG tablet TAKE 1 TABLET BY MOUTH DAILY. 90 tablet 0  . Na Sulfate-K Sulfate-Mg Sulf (SUPREP BOWEL PREP KIT) 17.5-3.13-1.6 GM/180ML SOLN Suprep as directed, no substitutions 354 mL 0  . sildenafil (VIAGRA) 100 MG tablet Take 1 tablet (100 mg total) by mouth daily as needed for erectile dysfunction. 60 tablet 0  . HYDROcodone-acetaminophen (NORCO) 7.5-325 MG tablet Take 1 tablet by mouth every 8 (eight) hours as needed for moderate pain. 90 tablet 0   No facility-administered medications prior to visit.     Review of Systems  Constitutional: Negative for chills and fever.  Respiratory: Negative for chest tightness and shortness of breath.   Musculoskeletal: Positive for back pain.  Neurological: Negative  for weakness and numbness.      Objective:    BP 124/74 (BP Location: Right Arm, Patient Position: Sitting, Cuff Size: Large)   Pulse 67   Temp 98.3 F (36.8 C) (Oral)   Resp 16   Ht 6' 3" (1.905 m)   Wt 234 lb (106.1 kg)   SpO2 94%   BMI 29.25 kg/m  Nursing note and vital signs reviewed.  Physical Exam  Constitutional: He is oriented to person, place, and time. He appears well-developed and well-nourished. No distress.  Cardiovascular: Normal rate, regular rhythm, normal heart sounds and intact distal pulses.   Pulmonary/Chest: Effort normal and breath sounds normal.  Neurological: He is alert and oriented to person, place, and time.  Skin: Skin is warm and dry.  Psychiatric: He has a normal mood and affect. His behavior is normal. Judgment and thought content normal.       Assessment & Plan:   Problem List Items Addressed This Visit      Nervous and Auditory   Lumbar radiculitis    Back pain stable with current medication regimen and no adverse side effects. Able to complete his activities of daily living and go to the gym. No current exacerbations. Continue current dosage of hydrocodone-acetaminophen.  [place] reviewed with no irregularities.      Relevant Medications   HYDROcodone-acetaminophen (NORCO) 7.5-325 MG tablet       I am having Mr. Gerstel maintain his Na Sulfate-K Sulfate-Mg Sulf, hydrocortisone, sildenafil, fluticasone, losartan-hydrochlorothiazide, and HYDROcodone-acetaminophen.   Meds ordered this encounter  Medications  . HYDROcodone-acetaminophen (  NORCO) 7.5-325 MG tablet    Sig: Take 1 tablet by mouth every 8 (eight) hours as needed for moderate pain.    Dispense:  90 tablet    Refill:  0    Order Specific Question:   Supervising Provider    Answer:   Pricilla Holm A [[id number]]     Follow-up: Return in about 2 months (around 09/20/2016), or if symptoms worsen or fail to improve.  Mauricio Po, FNP

## 2016-07-21 NOTE — Assessment & Plan Note (Signed)
Back pain stable with current medication regimen and no adverse side effects. Able to complete his activities of daily living and go to the gym. No current exacerbations. Continue current dosage of hydrocodone-acetaminophen.  [place] reviewed with no irregularities.

## 2016-07-24 ENCOUNTER — Ambulatory Visit: Payer: Medicare Other | Admitting: Physical Therapy

## 2016-07-30 ENCOUNTER — Encounter (INDEPENDENT_AMBULATORY_CARE_PROVIDER_SITE_OTHER): Payer: Self-pay | Admitting: Orthopedic Surgery

## 2016-07-30 ENCOUNTER — Ambulatory Visit (INDEPENDENT_AMBULATORY_CARE_PROVIDER_SITE_OTHER): Payer: Medicare Other | Admitting: Orthopedic Surgery

## 2016-07-30 DIAGNOSIS — M7582 Other shoulder lesions, left shoulder: Secondary | ICD-10-CM

## 2016-07-30 MED ORDER — IBUPROFEN 600 MG PO TABS: 600.0000 mg | ORAL_TABLET | Freq: Two times a day (BID) | ORAL | 1 refills | Status: DC | PRN
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
## 2016-08-09 ENCOUNTER — Other Ambulatory Visit: Payer: Self-pay | Admitting: Family

## 2016-08-20 ENCOUNTER — Ambulatory Visit (INDEPENDENT_AMBULATORY_CARE_PROVIDER_SITE_OTHER): Payer: Medicare Other | Admitting: Family

## 2016-08-20 ENCOUNTER — Encounter: Payer: Self-pay | Admitting: Family

## 2016-08-20 DIAGNOSIS — M5416 Radiculopathy, lumbar region: Secondary | ICD-10-CM | POA: Diagnosis not present

## 2016-08-20 MED ORDER — HYDROCODONE-ACETAMINOPHEN 7.5-325 MG PO TABS: 1.0000 | ORAL_TABLET | Freq: Three times a day (TID) | ORAL | 0 refills | Status: DC | PRN

## 2016-08-20 NOTE — Progress Notes (Signed)
Subjective:    Patient ID: Alex Hogan, male    DOB: August 29, 1960, 56 y.o.   MRN: [id number]  Chief Complaint  Patient presents with  . Medication Refill    pain med    HPI:  Alex Hogan is a 56 y.o. male who  has a past medical history of Blood transfusion without reported diagnosis; Heart murmur; Hypertension; Lumbosacral neuritis; Lumbosacral radiculitis; Spinal stenosis, lumbar region, without neurogenic claudication; and Substance abuse. and presents today for a follow up office visit.   Chronic lumbar back pain - Currently maintained on hydrocodone-acetaminophen and reports taking the medication as prescribed and denies adverse side effects or constipation. Pain is reasonable controlled with current medication regimen and able to continue to function on a regular basis. Continues to work out in the gym 1-2 times per week.    Allergies  Allergen Reactions  . Reglan [Metoclopramide Hcl]     vomiting      Outpatient Medications Prior to Visit  Medication Sig Dispense Refill  . fluticasone (FLONASE) 50 MCG/ACT nasal spray     . hydrocortisone (ANUSOL-HC) 2.5 % rectal cream Place 1 application rectally 2 (two) times daily. 30 g 0  . ibuprofen (ADVIL,MOTRIN) 600 MG tablet Take 1 tablet (600 mg total) by mouth 2 (two) times daily as needed (pain). 60 tablet 1  . losartan-hydrochlorothiazide (HYZAAR) 100-25 MG tablet TAKE 1 TABLET BY MOUTH DAILY. 90 tablet 0  . Na Sulfate-K Sulfate-Mg Sulf (SUPREP BOWEL PREP KIT) 17.5-3.13-1.6 GM/180ML SOLN Suprep as directed, no substitutions 354 mL 0  . sildenafil (VIAGRA) 100 MG tablet Take 1 tablet (100 mg total) by mouth daily as needed for erectile dysfunction. 60 tablet 0  . HYDROcodone-acetaminophen (NORCO) 7.5-325 MG tablet Take 1 tablet by mouth every 8 (eight) hours as needed for moderate pain. 90 tablet 0   No facility-administered medications prior to visit.     Review of Systems  Constitutional: Negative for chills and  fever.  Respiratory: Negative for chest tightness and shortness of breath.   Musculoskeletal: Positive for back pain.  Neurological: Negative for weakness and numbness.      Objective:    BP 126/82 (BP Location: Left Arm, Patient Position: Sitting, Cuff Size: Large)   Pulse (!) 55   Temp 98 F (36.7 C) (Oral)   Resp 16   Ht '6\' 3"'  (1.905 m)   Wt 239 lb (108.4 kg)   SpO2 98%   BMI 29.87 kg/m  Nursing note and vital signs reviewed.  Physical Exam  Constitutional: He is oriented to person, place, and time. He appears well-developed and well-nourished. No distress.  Cardiovascular: Normal rate, regular rhythm, normal heart sounds and intact distal pulses.   Pulmonary/Chest: Effort normal and breath sounds normal.  Neurological: He is alert and oriented to person, place, and time.  Skin: Skin is warm and dry.  Psychiatric: He has a normal mood and affect. His behavior is normal. Judgment and thought content normal.       Assessment & Plan:   Problem List Items Addressed This Visit      Nervous and Auditory   Lumbar radiculitis    Stable with current medication regimen and no adverse side effects or constipation. Continues to be functional and able to complete his activities of daily living. [place] Controlled Substance Database reviewed with no irregularities. Due for UDS at next office visit. Follow up in 1 month or sooner if needed.       Relevant Medications  HYDROcodone-acetaminophen (NORCO) 7.5-325 MG tablet       I am having Mr. Bielicki maintain his Na Sulfate-K Sulfate-Mg Sulf, hydrocortisone, sildenafil, fluticasone, ibuprofen, losartan-hydrochlorothiazide, and HYDROcodone-acetaminophen.   Meds ordered this encounter  Medications  . HYDROcodone-acetaminophen (NORCO) 7.5-325 MG tablet    Sig: Take 1 tablet by mouth every 8 (eight) hours as needed for moderate pain.    Dispense:  90 tablet    Refill:  0    Order Specific Question:   Supervising Provider    Answer:    Pricilla Holm A [[id number]]     Follow-up: Return in about 1 month (around 09/20/2016), or if symptoms worsen or fail to improve.  Mauricio Po, FNP

## 2016-08-20 NOTE — Patient Instructions (Signed)
Thank you for choosing [facility].  SUMMARY AND INSTRUCTIONS:  Please continue to take your medication as prescribed.   Medication:  Your prescription(s) have been submitted to your pharmacy or been printed and provided for you. Please take as directed and contact our office if you believe you are having problem(s) with the medication(s) or have any questions.  Follow up:  If your symptoms worsen or fail to improve, please contact our office for further instruction, or in case of emergency go directly to the emergency room at the closest medical facility.     

## 2016-08-20 NOTE — Assessment & Plan Note (Signed)
Stable with current medication regimen and no adverse side effects or constipation. Continues to be functional and able to complete his activities of daily living.  Controlled Substance Database reviewed with no irregularities. Due for UDS at next office visit. Follow up in 1 month or sooner if needed.

## 2016-08-23 ENCOUNTER — Other Ambulatory Visit: Payer: Self-pay | Admitting: Family

## 2016-09-02 ENCOUNTER — Telehealth: Payer: Self-pay | Admitting: Family

## 2016-09-02 DIAGNOSIS — N529 Male erectile dysfunction, unspecified: Secondary | ICD-10-CM

## 2016-09-02 MED ORDER — SILDENAFIL CITRATE 100 MG PO TABS: 100.0000 mg | ORAL_TABLET | Freq: Every day | ORAL | 0 refills | Status: DC | PRN

## 2016-09-02 NOTE — Telephone Encounter (Signed)
Pt called asking for a refill of his sildenafil (VIAGRA) 100 MG tablet    [employer] Pt assist program .

## 2016-09-02 NOTE — Telephone Encounter (Signed)
Called [employer] @ [phone] gave ID # [id number] place or der for Viagra 100 mg. Automatic call took request and states medication will be shipped to [place] address in 5-10 business days. Notified pt med has been ordered,,,/lmb

## 2016-09-09 NOTE — Telephone Encounter (Signed)
Pt called regarding this states it was delivered, he would like to know if we have it, please advise and call back

## 2016-09-09 NOTE — Telephone Encounter (Signed)
Called pt to let him know his rx is ready for pick up.

## 2016-09-19 ENCOUNTER — Ambulatory Visit (INDEPENDENT_AMBULATORY_CARE_PROVIDER_SITE_OTHER): Payer: Medicare Other | Admitting: Family

## 2016-09-19 ENCOUNTER — Encounter: Payer: Self-pay | Admitting: Family

## 2016-09-19 DIAGNOSIS — Z79891 Long term (current) use of opiate analgesic: Secondary | ICD-10-CM | POA: Diagnosis not present

## 2016-09-19 DIAGNOSIS — M5416 Radiculopathy, lumbar region: Secondary | ICD-10-CM

## 2016-09-19 DIAGNOSIS — G479 Sleep disorder, unspecified: Secondary | ICD-10-CM | POA: Diagnosis not present

## 2016-09-19 DIAGNOSIS — G8929 Other chronic pain: Secondary | ICD-10-CM | POA: Insufficient documentation

## 2016-09-19 MED ORDER — HYDROCODONE-ACETAMINOPHEN 7.5-325 MG PO TABS: 1.0000 | ORAL_TABLET | Freq: Three times a day (TID) | ORAL | 0 refills | Status: DC | PRN

## 2016-09-19 NOTE — Patient Instructions (Addendum)
Thank you for choosing [facility].  SUMMARY AND INSTRUCTIONS:  Continue to take your medication as prescribed.  Try Melatonin or Unisom for sleep   Medication:  Your prescription(s) have been submitted to your pharmacy or been printed and provided for you. Please take as directed and contact our office if you believe you are having problem(s) with the medication(s) or have any questions.  Follow up:  If your symptoms worsen or fail to improve, please contact our office for further instruction, or in case of emergency go directly to the emergency room at the closest medical facility.
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
## 2016-09-19 NOTE — Assessment & Plan Note (Signed)
Symptoms consistent with new onset sleep disturbance. Encouraged try over-the-counter melatonin or Unisom. Encourage good sleep hygiene. Follow-up if symptoms do not improve with over-the-counter regimens.

## 2016-09-29 ENCOUNTER — Other Ambulatory Visit (INDEPENDENT_AMBULATORY_CARE_PROVIDER_SITE_OTHER): Payer: Self-pay | Admitting: Orthopedic Surgery

## 2016-09-29 NOTE — Telephone Encounter (Signed)
Ok to rf? 

## 2016-09-29 NOTE — Telephone Encounter (Signed)
y

## 2016-10-15 ENCOUNTER — Other Ambulatory Visit (INDEPENDENT_AMBULATORY_CARE_PROVIDER_SITE_OTHER): Payer: Self-pay

## 2016-10-15 MED ORDER — IBUPROFEN 600 MG PO TABS: 600.0000 mg | ORAL_TABLET | Freq: Two times a day (BID) | ORAL | 0 refills | Status: DC | PRN

## 2016-10-22 ENCOUNTER — Encounter: Payer: Self-pay | Admitting: Family

## 2016-10-22 ENCOUNTER — Ambulatory Visit (INDEPENDENT_AMBULATORY_CARE_PROVIDER_SITE_OTHER): Payer: Medicare Other | Admitting: Family

## 2016-10-22 ENCOUNTER — Telehealth: Payer: Self-pay | Admitting: Family

## 2016-10-22 VITALS — BP 128/76 | HR 57 | Temp 98.1°F | Resp 16 | Ht 75.0 in | Wt 234.0 lb

## 2016-10-22 DIAGNOSIS — I10 Essential (primary) hypertension: Secondary | ICD-10-CM | POA: Diagnosis not present

## 2016-10-22 DIAGNOSIS — M5416 Radiculopathy, lumbar region: Secondary | ICD-10-CM

## 2016-10-22 DIAGNOSIS — Z23 Encounter for immunization: Secondary | ICD-10-CM | POA: Diagnosis not present

## 2016-10-22 MED ORDER — HYDROCODONE-ACETAMINOPHEN 7.5-325 MG PO TABS: 1.0000 | ORAL_TABLET | Freq: Three times a day (TID) | ORAL | 0 refills | Status: DC | PRN

## 2016-10-22 NOTE — Patient Instructions (Addendum)
Thank you for choosing [facility].  SUMMARY AND INSTRUCTIONS:  Please continue to take your medications as prescribed.   Continue to monitor your blood pressure at home and follow a low sodium diet.  Follow up in 1 month or sooner to establish with new provider.   Continue to work on home exercise and ice/moist heat.   Medication:  Your prescription(s) have been submitted to your pharmacy or been printed and provided for you. Please take as directed and contact our office if you believe you are having problem(s) with the medication(s) or have any questions.  Follow up:  If your symptoms worsen or fail to improve, please contact our office for further instruction, or in case of emergency go directly to the emergency room at the closest medical facility.
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
## 2016-10-22 NOTE — Assessment & Plan Note (Signed)
Lumbar radiculitis appears stable with no current exacerbations with current medication regimen and no adverse side effects or constipation. [place] controlled substance database reviewed with no irregularities. Continue non-pharmacological treatments as well as current dosage of hydrocodone-acetaminophen.

## 2016-10-22 NOTE — Telephone Encounter (Signed)
HYDROcodone-acetaminophen (NORCO) 7.5-325 MG tablet   Patient has a appointment to transfer care in November. If we could just have his RX printed for Oct to be picked up by the 19th. Thank you.

## 2016-10-22 NOTE — Telephone Encounter (Signed)
Prescription printed

## 2016-10-22 NOTE — Assessment & Plan Note (Signed)
Blood pressure well-controlled and below goal 140/90 with current medication regimen and no adverse side effects. Continue current dosage of losartan-hydrochlorothiazide. Encouraged to monitor blood pressure at home and follow low-sodium diet.

## 2016-10-23 NOTE — Telephone Encounter (Signed)
Patient has been informed.

## 2016-12-02 ENCOUNTER — Telehealth: Payer: Self-pay | Admitting: Family

## 2016-12-02 NOTE — Telephone Encounter (Signed)
Called [employer] pt could not get a refill due to provider is no longer here. Before pt can get new shipment he is needing to see [name] to be able to complete forms and provide new prescription. Called pt inform him what rep inform me. Pt states he has appt already set up w/[name] for 12/22/16 will bring new forms when he come for appt...Alex Hogan

## 2016-12-02 NOTE — Telephone Encounter (Signed)
Pt called requesting an order for sildenafil (VIAGRA) 100 MG tablet be sent in to [place].

## 2016-12-22 ENCOUNTER — Ambulatory Visit (INDEPENDENT_AMBULATORY_CARE_PROVIDER_SITE_OTHER): Payer: Medicare Other | Admitting: Nurse Practitioner

## 2016-12-22 ENCOUNTER — Encounter: Payer: Self-pay | Admitting: Nurse Practitioner

## 2016-12-22 VITALS — BP 132/84 | HR 63 | Temp 98.1°F | Resp 16 | Ht 75.0 in | Wt 239.0 lb

## 2016-12-22 DIAGNOSIS — M79661 Pain in right lower leg: Secondary | ICD-10-CM

## 2016-12-22 DIAGNOSIS — S81811S Laceration without foreign body, right lower leg, sequela: Secondary | ICD-10-CM | POA: Diagnosis not present

## 2016-12-22 DIAGNOSIS — I10 Essential (primary) hypertension: Secondary | ICD-10-CM | POA: Diagnosis not present

## 2016-12-22 DIAGNOSIS — M5441 Lumbago with sciatica, right side: Secondary | ICD-10-CM

## 2016-12-22 DIAGNOSIS — M5416 Radiculopathy, lumbar region: Secondary | ICD-10-CM | POA: Diagnosis not present

## 2016-12-22 DIAGNOSIS — S81819A Laceration without foreign body, unspecified lower leg, initial encounter: Secondary | ICD-10-CM | POA: Insufficient documentation

## 2016-12-22 DIAGNOSIS — G8929 Other chronic pain: Secondary | ICD-10-CM

## 2016-12-22 MED ORDER — HYDROCODONE-ACETAMINOPHEN 7.5-325 MG PO TABS: 1.0000 | ORAL_TABLET | Freq: Three times a day (TID) | ORAL | 0 refills | Status: DC | PRN

## 2016-12-22 MED ORDER — GABAPENTIN 300 MG PO CAPS: 300.0000 mg | ORAL_CAPSULE | Freq: Three times a day (TID) | ORAL | 2 refills | Status: DC

## 2016-12-22 MED ORDER — LOSARTAN POTASSIUM-HCTZ 100-25 MG PO TABS: 1.0000 | ORAL_TABLET | Freq: Every day | ORAL | 2 refills | Status: DC

## 2016-12-22 NOTE — Patient Instructions (Addendum)
Please head downstairs for lab work.  I have provided refill of your vicodin three times a day. We will have to see you back every 3 months for a pain visit only to continue this refill.  I have sent a new prescription for gabapentin 300mg  for nerve pain. Please start 300 mg by mouth on the first three days, then 300mg  twice a day on the next three days, then 300mg   Three times a day, then remain on 300mg  three times day. Well see if this is helping improve your pain at your next visit.  I have placed an order for an ultrasound of your leg to make sure you dont have a blood clot, our office will call you to schedule that appoinment.  Ill see you back in 6 months for routine follow up of your blood pressure.  It was nice to meet you. Thanks for letting me take care of you today :)
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
## 2016-12-23 NOTE — Assessment & Plan Note (Deleted)
Maintained on norco 7.5-325 mg TID for pain. Discussed alternatives to management of chronic pain due to risk of long term opiate use- we discussed injections, pain clinic, PT. He has tried all of these in the past and did not experience pain relief. He is hesitant to try a new pain regimen as he feels his pain is controlled with current medication dosing. We dicussed need for pain management visits every 3 months as well as annual/random UDS and pain contracts for further norco prescriptions to which he was agreeable. Will continue norco at current dosage, 3- 1 month refills given. RTC in 3 months for pain management visit. Controlled substance registry reviewed with no irregularities. UDS up to date.

## 2016-12-28 NOTE — Assessment & Plan Note (Signed)
Blood pressure maintained below goal of 140/90 on hyzaar. Denies adverse medication effects. Denies headaches, vision changes, chest pain, shortness of breath. Encouraged maintenance of healthy diet and exercise. He'll continue current dosage of medications. Follow up in 6 months. BMET ordered today, he would like to return for lab draw next week due to time constraints today.

## 2016-12-28 NOTE — Assessment & Plan Note (Addendum)
Maintained on HYDROcodone-acetaminophen (NORCO) 7.5-325 MG tablet; Take 1 tablet every 8 (eight) hours as needed by mouth for moderate pain.  Dispense: 90 tablet; Refill: 0; 3 - 1 month refills given as printed paper Rx Discussed alternatives to pain management including injections, pain clinic, PT. He has tried these things in the past and is adamant about remaining on [place] TID as he feels this is the only regimen that controls his pain. Will continue norco at current dosing and continue to see patient every 3 months for pain management visit per pain management policy. He is aware of policy and agreeable. Controlled substance registry reviewed with no irregularities. UDS up to date.

## 2016-12-28 NOTE — Assessment & Plan Note (Signed)
Requesting trial of gabapentin, as he has had similar pain in the past which was relieved by gabapentin. - gabapentin (NEURONTIN) 300 MG capsule; Take 1 capsule (300 mg total) 3 (three) times daily by mouth.  Dispense: 90 capsule; Refill: 2 Medication side effects including drowsiness discussed. Consider prednisone therapy if no relief.

## 2017-01-07 ENCOUNTER — Encounter (HOSPITAL_COMMUNITY): Payer: Medicare Other

## 2017-01-09 ENCOUNTER — Telehealth: Payer: Self-pay

## 2017-01-09 DIAGNOSIS — N529 Male erectile dysfunction, unspecified: Secondary | ICD-10-CM

## 2017-01-09 NOTE — Telephone Encounter (Signed)
[employer] patient assistance form has been completed and faxed.

## 2017-01-13 ENCOUNTER — Inpatient Hospital Stay (HOSPITAL_COMMUNITY)
Admission: RE | Admit: 2017-01-13 | Payer: Medicare Other | Source: Ambulatory Visit | Attending: Nurse Practitioner | Admitting: Nurse Practitioner

## 2017-01-21 ENCOUNTER — Inpatient Hospital Stay (HOSPITAL_COMMUNITY)
Admission: RE | Admit: 2017-01-21 | Payer: Medicare Other | Source: Ambulatory Visit | Attending: Nurse Practitioner | Admitting: Nurse Practitioner

## 2017-01-29 MED ORDER — SILDENAFIL CITRATE 100 MG PO TABS: 100.0000 mg | ORAL_TABLET | Freq: Every day | ORAL | 0 refills | Status: DC | PRN

## 2017-01-29 NOTE — Addendum Note (Signed)
Addended by: Delice Bison E on: 01/29/2017 03:41 PM   Modules accepted: Orders

## 2017-01-29 NOTE — Telephone Encounter (Addendum)
PT dropped off the correction form yesterday  And wants to let Caesar Chestnut know she just needs to write the rx and fax to [place]  Rx is for  Viagra 100mg  30  For 90 days Pt would like a call back when this is completed

## 2017-02-05 ENCOUNTER — Ambulatory Visit (HOSPITAL_COMMUNITY)
Admission: RE | Admit: 2017-02-05 | Payer: Medicare Other | Source: Ambulatory Visit | Attending: Nurse Practitioner | Admitting: Nurse Practitioner

## 2017-02-06 ENCOUNTER — Telehealth: Payer: Self-pay | Admitting: Nurse Practitioner

## 2017-02-06 NOTE — Telephone Encounter (Signed)
Pt states he still has not received RX. Was told it was at office yesterday.  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

## 2017-02-06 NOTE — Telephone Encounter (Signed)
We have received this and he has been notified.

## 2017-02-06 NOTE — Telephone Encounter (Signed)
Copied from [place] [id number]. Topic: Quick Communication - See Telephone Encounter >> Feb 06, 2017 10:46 AM Corie Chiquito, NT wrote: CRM for notification. See Telephone encounter for: Patient calling because he was told that his Viagra was sent to the office yesterday and he still has not received it. If someone could give him a call back at [phone]  02/06/17.

## 2017-02-12 ENCOUNTER — Ambulatory Visit (HOSPITAL_COMMUNITY)
Admission: RE | Admit: 2017-02-12 | Payer: Medicare Other | Source: Ambulatory Visit | Attending: Nurse Practitioner | Admitting: Nurse Practitioner

## 2017-02-18 ENCOUNTER — Ambulatory Visit (HOSPITAL_COMMUNITY)
Admission: RE | Admit: 2017-02-18 | Payer: Medicare Other | Source: Ambulatory Visit | Attending: Cardiology | Admitting: Cardiology

## 2017-03-03 ENCOUNTER — Ambulatory Visit (HOSPITAL_COMMUNITY)
Admission: RE | Admit: 2017-03-03 | Discharge: 2017-03-03 | Disposition: A | Discharge status: Home / Self Care | Payer: Medicare Other | Source: Ambulatory Visit | Attending: Internal Medicine | Admitting: Internal Medicine

## 2017-03-03 DIAGNOSIS — M79661 Pain in right lower leg: Secondary | ICD-10-CM | POA: Diagnosis not present

## 2017-03-23 ENCOUNTER — Ambulatory Visit (INDEPENDENT_AMBULATORY_CARE_PROVIDER_SITE_OTHER): Payer: Medicare Other | Admitting: Nurse Practitioner

## 2017-03-23 ENCOUNTER — Encounter: Payer: Self-pay | Admitting: Nurse Practitioner

## 2017-03-23 VITALS — BP 144/84 | HR 50 | Temp 98.0°F | Resp 16 | Ht 75.0 in | Wt 241.0 lb

## 2017-03-23 DIAGNOSIS — R52 Pain, unspecified: Secondary | ICD-10-CM | POA: Diagnosis not present

## 2017-03-23 DIAGNOSIS — M5416 Radiculopathy, lumbar region: Secondary | ICD-10-CM

## 2017-03-23 DIAGNOSIS — I10 Essential (primary) hypertension: Secondary | ICD-10-CM | POA: Diagnosis not present

## 2017-03-23 MED ORDER — HYDROCODONE-ACETAMINOPHEN 7.5-325 MG PO TABS: 1.0000 | ORAL_TABLET | Freq: Three times a day (TID) | ORAL | 0 refills | Status: DC | PRN

## 2017-03-23 NOTE — Patient Instructions (Addendum)
For your back pain, please follow up with one of our sports medicine providers. The front desk can help you schedule this today.  Please head downstairs for lab work. Please continue to monitor your blood pressure at home- let me know if you are getting readings over 140/90 consistently.  Please return in about 3 months for follow up- we can do your physical that day if you are feeling well.  Mediterranean Diet A Mediterranean diet refers to food and lifestyle choices that are based on the traditions of countries located on the [employer]. This way of eating has been shown to help prevent certain conditions and improve outcomes for people who have chronic diseases, like kidney disease and heart disease. What are tips for following this plan? Lifestyle  Cook and eat meals together with your family, when possible.  Drink enough fluid to keep your urine clear or pale yellow.  Be physically active every day. This includes: ? Aerobic exercise like running or swimming. ? Leisure activities like gardening, walking, or housework.  Get 7-8 hours of sleep each night.  If recommended by your health care provider, drink red wine in moderation. This means 1 glass a day for nonpregnant women and 2 glasses a day for men. A glass of wine equals 5 oz (150 mL). Reading food labels  Check the serving size of packaged foods. For foods such as rice and pasta, the serving size refers to the amount of cooked product, not dry.  Check the total fat in packaged foods. Avoid foods that have saturated fat or trans fats.  Check the ingredients list for added sugars, such as corn syrup. Shopping  At the grocery store, buy most of your food from the areas near the walls of the store. This includes: ? Fresh fruits and vegetables (produce). ? Grains, beans, nuts, and seeds. Some of these may be available in unpackaged forms or large amounts (in bulk). ? Fresh seafood. ? Poultry and eggs. ? Low-fat dairy  products.  Buy whole ingredients instead of prepackaged foods.  Buy fresh fruits and vegetables in-season from local farmers markets.  Buy frozen fruits and vegetables in resealable bags.  If you do not have access to quality fresh seafood, buy precooked frozen shrimp or canned fish, such as tuna, salmon, or sardines.  Buy small amounts of raw or cooked vegetables, salads, or olives from the deli or salad bar at your store.  Stock your pantry so you always have certain foods on hand, such as olive oil, canned tuna, canned tomatoes, rice, pasta, and beans. Cooking  Cook foods with extra-virgin olive oil instead of using butter or other vegetable oils.  Have meat as a side dish, and have vegetables or grains as your main dish. This means having meat in small portions or adding small amounts of meat to foods like pasta or stew.  Use beans or vegetables instead of meat in common dishes like chili or lasagna.  Experiment with different cooking methods. Try roasting or broiling vegetables instead of steaming or sauteing them.  Add frozen vegetables to soups, stews, pasta, or rice.  Add nuts or seeds for added healthy fat at each meal. You can add these to yogurt, salads, or vegetable dishes.  Marinate fish or vegetables using olive oil, lemon juice, garlic, and fresh herbs. Meal planning  Plan to eat 1 vegetarian meal one day each week. Try to work up to 2 vegetarian meals, if possible.  Eat seafood 2 or more times a  week.  Have healthy snacks readily available, such as: ? Vegetable sticks with hummus. ? [place] yogurt. ? Fruit and nut trail mix.  Eat balanced meals throughout the week. This includes: ? Fruit: 2-3 servings a day ? Vegetables: 4-5 servings a day ? Low-fat dairy: 2 servings a day ? Fish, poultry, or lean meat: 1 serving a day ? Beans and legumes: 2 or more servings a week ? Nuts and seeds: 1-2 servings a day ? Whole grains: 6-8 servings a day ? Extra-virgin  olive oil: 3-4 servings a day  Limit red meat and sweets to only a few servings a month What are my food choices?  Mediterranean diet ? Recommended ? Grains: Whole-grain pasta. Brown rice. Bulgar wheat. Polenta. Couscous. Whole-wheat bread. [place]. ? Vegetables: Artichokes. Beets. Broccoli. Cabbage. Carrots. Eggplant. Green beans. Chard. Kale. Spinach. Onions. Leeks. Peas. Squash. Tomatoes. Peppers. Radishes. ? Fruits: Apples. Apricots. Avocado. Berries. Bananas. Cherries. Dates. Figs. Grapes. Lemons. Melon. Oranges. Peaches. Plums. Pomegranate. ? Meats and other protein foods: Beans. Almonds. Sunflower seeds. Pine nuts. Peanuts. [place]. Salmon. Scallops. Shrimp. [place]. Tilapia. Clams. Oysters. Eggs. ? Dairy: Low-fat milk. Cheese. Greek yogurt. ? Beverages: Water. Red wine. Herbal tea. ? Fats and oils: Extra virgin olive oil. Avocado oil. Grape seed oil. ? Sweets and desserts: [place] yogurt with honey. Baked apples. Poached pears. Trail mix. ? Seasoning and other foods: Basil. Cilantro. Coriander. Cumin. Mint. Parsley. Sage. Rosemary. Tarragon. Garlic. Oregano. Thyme. Pepper. Balsalmic vinegar. Tahini. Hummus. Tomato sauce. Olives. Mushrooms. ? Limit these ? Grains: Prepackaged pasta or rice dishes. Prepackaged cereal with added sugar. ? Vegetables: Deep fried potatoes (french fries). ? Fruits: Fruit canned in syrup. ? Meats and other protein foods: Beef. Pork. Lamb. Poultry with skin. Hot dogs. Bernie[place] Salines. ? Dairy: Ice cream. Sour cream. Whole milk. ? Beverages: Juice. Sugar-sweetened soft drinks. Beer. Liquor and spirits. ? Fats and oils: Butter. Canola oil. Vegetable oil. Beef fat (tallow). Lard. ? Sweets and desserts: Cookies. Cakes. Pies. Candy. ? Seasoning and other foods: Mayonnaise. Premade sauces and marinades. ? The items listed may not be a complete list. Talk with your dietitian about what dietary choices are right for you. Summary  The Mediterranean diet includes both food  and lifestyle choices.  Eat a variety of fresh fruits and vegetables, beans, nuts, seeds, and whole grains.  Limit the amount of red meat and sweets that you eat.  Talk with your health care provider about whether it is safe for you to drink red wine in moderation. This means 1 glass a day for nonpregnant women and 2 glasses a day for men. A glass of wine equals 5 oz (150 mL). This information is not intended to replace advice given to you by your health care provider. Make sure you discuss any questions you have with your health care provider. Document Released: 09/13/2015 Document Revised: 10/16/2015 Document Reviewed: 09/13/2015 Elsevier Interactive Patient Education  [employer].
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
## 2017-03-23 NOTE — Assessment & Plan Note (Signed)
BP elevated slightly today, he states likely due to his back pain. He does not want to make any medication changes today, but requests to monitor BP at home and notify me of elevated readings.- parameters given in AVS He does check his BP daily at home and he seems motivated to monitor at home. We talked about the role of healthy diet and exercise in the management of high blood pressure. He did not stop by lab for BMET at last visit and I requested he do so today. Will continue current meds and he'll follow up in about 3 months.

## 2017-04-29 ENCOUNTER — Telehealth: Payer: Self-pay | Admitting: Nurse Practitioner

## 2017-04-29 ENCOUNTER — Other Ambulatory Visit: Payer: Self-pay | Admitting: *Deleted

## 2017-04-29 DIAGNOSIS — N529 Male erectile dysfunction, unspecified: Secondary | ICD-10-CM

## 2017-04-29 NOTE — Telephone Encounter (Signed)
Order has been placed. Order # [id number]. Next refill date was 07/06/17.

## 2017-04-29 NOTE — Telephone Encounter (Signed)
Sildenafil LOV: 12/22/16 PCP: [facility]: pt states he will pick this up in office

## 2017-04-29 NOTE — Telephone Encounter (Signed)
Copied from [place] [phone]. Topic: Quick Communication - Rx Refill/Question >> Apr 29, 2017  1:41 PM Arletha Grippe wrote: Medication: sildenafil (VIAGRA) 100 MG table Has the patient contacted their pharmacy? No. (Agent: If no, request that the patient contact the pharmacy for the refill.) Preferred Pharmacy (with phone number or street name): [facility]  Agent: Please be advised that RX refills may take up to 3 business days. We ask that you follow-up with your pharmacy.  Pt says he talked to [facility] and it is time to refill his medications through them.  Ia sked for a phone umber but he said that the assistant does everything and has all the contact info.  Pt picks this up at the office.

## 2017-05-07 NOTE — Telephone Encounter (Signed)
Pt notifed we recievdd med
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
## 2017-06-22 ENCOUNTER — Ambulatory Visit (INDEPENDENT_AMBULATORY_CARE_PROVIDER_SITE_OTHER): Payer: Medicare Other | Admitting: Family Medicine

## 2017-06-22 ENCOUNTER — Encounter: Payer: Self-pay | Admitting: Nurse Practitioner

## 2017-06-22 ENCOUNTER — Other Ambulatory Visit (INDEPENDENT_AMBULATORY_CARE_PROVIDER_SITE_OTHER): Payer: Medicare Other

## 2017-06-22 ENCOUNTER — Ambulatory Visit (INDEPENDENT_AMBULATORY_CARE_PROVIDER_SITE_OTHER): Payer: Medicare Other | Admitting: Nurse Practitioner

## 2017-06-22 ENCOUNTER — Telehealth: Payer: Self-pay | Admitting: Nurse Practitioner

## 2017-06-22 ENCOUNTER — Encounter: Payer: Self-pay | Admitting: Family Medicine

## 2017-06-22 ENCOUNTER — Ambulatory Visit (INDEPENDENT_AMBULATORY_CARE_PROVIDER_SITE_OTHER)
Admission: RE | Admit: 2017-06-22 | Discharge: 2017-06-22 | Disposition: A | Discharge status: Home / Self Care | Payer: Medicare Other | Source: Ambulatory Visit | Attending: Family Medicine | Admitting: Family Medicine

## 2017-06-22 VITALS — BP 148/90 | HR 56 | Resp 16 | Ht 75.0 in | Wt 238.0 lb

## 2017-06-22 VITALS — BP 142/90 | HR 52 | Ht 75.0 in | Wt 238.0 lb

## 2017-06-22 DIAGNOSIS — M545 Low back pain: Secondary | ICD-10-CM

## 2017-06-22 DIAGNOSIS — M5416 Radiculopathy, lumbar region: Secondary | ICD-10-CM

## 2017-06-22 DIAGNOSIS — F1120 Opioid dependence, uncomplicated: Secondary | ICD-10-CM | POA: Insufficient documentation

## 2017-06-22 DIAGNOSIS — G8929 Other chronic pain: Secondary | ICD-10-CM | POA: Diagnosis not present

## 2017-06-22 DIAGNOSIS — R52 Pain, unspecified: Secondary | ICD-10-CM | POA: Insufficient documentation

## 2017-06-22 DIAGNOSIS — I10 Essential (primary) hypertension: Secondary | ICD-10-CM

## 2017-06-22 LAB — COMPREHENSIVE METABOLIC PANEL
ALBUMIN: 3.9 g/dL (ref 3.5–5.2)
ALT: 15 U/L (ref 0–53)
AST: 13 U/L (ref 0–37)
Alkaline Phosphatase: 35 U/L — ABNORMAL LOW (ref 39–117)
BILIRUBIN TOTAL: 0.5 mg/dL (ref 0.2–1.2)
BUN: 16 mg/dL (ref 6–23)
CALCIUM: 9.1 mg/dL (ref 8.4–10.5)
CHLORIDE: 104 meq/L (ref 96–112)
CO2: 28 meq/L (ref 19–32)
CREATININE: 1.38 mg/dL (ref 0.40–1.50)
GFR: 68.16 mL/min (ref >60.00)
Glucose, Bld: 95 mg/dL (ref 70–99)
Potassium: 4.1 mEq/L (ref 3.5–5.1)
Sodium: 139 mEq/L (ref 135–145)
Total Protein: 7.1 g/dL (ref 6.0–8.3)

## 2017-06-22 LAB — CBC
HEMATOCRIT: 43.4 % (ref 39.0–52.0)
Hemoglobin: 14.5 g/dL (ref 13.0–17.0)
MCHC: 33.3 g/dL (ref 30.0–36.0)
MCV: 89.4 fl (ref 78.0–100.0)
PLATELETS: 279 10*3/uL (ref 150.0–400.0)
RBC: 4.85 Mil/uL (ref 4.22–5.81)
RDW: 14.2 % (ref 11.5–15.5)
WBC: 5.8 10*3/uL (ref 4.0–10.5)

## 2017-06-22 MED ORDER — HYDROCODONE-ACETAMINOPHEN 7.5-325 MG PO TABS: 1.0000 | ORAL_TABLET | Freq: Three times a day (TID) | ORAL | 0 refills | Status: DC | PRN

## 2017-06-22 MED ORDER — GABAPENTIN 300 MG PO CAPS: 300.0000 mg | ORAL_CAPSULE | Freq: Every day | ORAL | 3 refills | Status: DC

## 2017-06-22 MED ORDER — TIZANIDINE HCL 4 MG PO TABS: 4.0000 mg | ORAL_TABLET | Freq: Four times a day (QID) | ORAL | 0 refills | Status: DC | PRN

## 2017-06-22 MED ORDER — PREDNISONE 50 MG PO TABS: 50.0000 mg | ORAL_TABLET | Freq: Every day | ORAL | 0 refills | Status: DC

## 2017-06-22 MED ORDER — HYDROCODONE-ACETAMINOPHEN 7.5-325 MG PO TABS: 1.0000 | ORAL_TABLET | Freq: Three times a day (TID) | ORAL | 0 refills | Status: DC

## 2017-06-22 MED ORDER — VITAMIN D (ERGOCALCIFEROL) 1.25 MG (50000 UNIT) PO CAPS: 50000.0000 [IU] | ORAL_CAPSULE | ORAL | 0 refills | Status: DC

## 2017-06-22 NOTE — Assessment & Plan Note (Signed)
Continue current medications at current dosages  Continue F/U with sports medicine - HYDROcodone-acetaminophen (NORCO) 7.5-325 MG tablet; Take 1 tablet by mouth every 8 (eight) hours as needed for moderate pain.  Dispense: 90 tablet; Refill: 0 - HYDROcodone-acetaminophen (NORCO) 7.5-325 MG tablet; Take 1 tablet by mouth every 8 (eight) hours.  Dispense: 90 tablet; Refill: 0 - HYDROcodone-acetaminophen (NORCO) 7.5-325 MG tablet; Take 1 tablet by mouth every 8 (eight) hours.  Dispense: 90 tablet; Refill: 0 RTC in 3 months for pain management F/U

## 2017-06-22 NOTE — Assessment & Plan Note (Addendum)
norco refill sent today-dosing and side effects discussed  Controlled substance registry reviewed with no irregularities. UDS ordered today. Controlled substances contract update  He is requesting increased dosage of vicodin today Again, as we discussed on prior visits, we discussed the risks of adverse effects of narcotic pain medications including addiction, respiratory depression, accident overdose. He says he does not care about these side effects he knows that they will not happen to him. Again, I explained that for his safety I will not increase his norco dosage, but would be glad to refer him to pain management or back specialists, or perhaps it would be in his best interest to establish with a new PCP but he says that he will stay at current dosage and continue to follow up with me if possible He did not go for lab work on last 2 appointments as instructed and does not seem interested in his other medical problems, we discussed that for me to safely care for him we must check annual labs and follow up on his other chronic care needs and he says he will go to lab for updated labs today - Pain Mgmt, Profile 8 w/Conf, U; Future

## 2017-06-22 NOTE — Assessment & Plan Note (Signed)
BP reading remains elevated today Patient again says that he knows his BP is elevated due to pain and does not want to make medication changes RTC in 3 months for F/U - CBC; Future - Comprehensive metabolic panel; Future

## 2017-06-22 NOTE — Telephone Encounter (Signed)
Didn't we take care of this today?

## 2017-06-22 NOTE — Patient Instructions (Addendum)
Please head downstairs for lab work/x-rays. If any of your test results are critically abnormal, you will be contacted right away. Your results may be released to your MyChart for viewing before I am able to provide you with my response. I will contact you within a week about your test results and any recommendations for abnormalities.  Please return in 3 months for routine pain management visit.  Please return in about 3 months for your annual check up.  Please continue follow up with Dr Tamala Julian for your back pain.

## 2017-06-22 NOTE — Patient Instructions (Signed)
Good to see you.  Ice 20 minutes 2 times daily. Usually after activity and before bed. Exercises 3 times a week.  pennsaid pinkie amount topically 2 times daily as needed.  Gabapentin 300mg  at night Prednisone daily for 5 days Zanaflex (muscle relaxer) as needed but try to avoid.  Once weekly vitamin D for 12 weeks Xrays downstairs today  Over the counter need tart cherry extract any dose See me again in 2 weeks
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
## 2017-06-22 NOTE — Assessment & Plan Note (Signed)
Patient does have some lumbar radiculitis.  We have noticed this.  I do though based on patient's symptoms could be concerning for more of a spinal stenosis.  Had some back 9 years ago and it would not be surprised if he is worsening at this time.  We discussed with patient in great length.  X-rays ordered today.  Gabapentin started and encouraged him to take it on a regular basis.  Patient was not taking it because he did not feel he was making benefit for some time.  We discussed prednisone short course.  Icing regimen.  Home exercises given.  Follow-up again in 4 weeks

## 2017-06-22 NOTE — Telephone Encounter (Signed)
Copied from [place] [id number] [id number]. Topic: Quick Communication - See Telephone Encounter >> Jun 22, 2017 11:57 AM Synthia Innocent wrote: CRM for notification. See Telephone encounter for: 06/22/17. Refill of HYDROcodone-acetaminophen (NORCO) 7.5-325 MG tablet was sent to wrong pharmacy, will pick up refill today at [facility], but requesting other 2 refills be sent to [facility]. Please advise

## 2017-06-23 NOTE — Telephone Encounter (Signed)
They are all listed as if they are going to [facility]. [facility]. Will you please change the other 2 as [facility] and I will call them going to [place].

## 2017-06-25 MED ORDER — HYDROCODONE-ACETAMINOPHEN 7.5-325 MG PO TABS: 1.0000 | ORAL_TABLET | Freq: Three times a day (TID) | ORAL | 0 refills | Status: DC

## 2017-06-25 NOTE — Telephone Encounter (Signed)
done
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
## 2017-07-09 ENCOUNTER — Ambulatory Visit (INDEPENDENT_AMBULATORY_CARE_PROVIDER_SITE_OTHER): Payer: Medicare Other | Admitting: Family Medicine

## 2017-07-09 ENCOUNTER — Encounter: Payer: Self-pay | Admitting: Family Medicine

## 2017-07-09 ENCOUNTER — Other Ambulatory Visit (INDEPENDENT_AMBULATORY_CARE_PROVIDER_SITE_OTHER): Payer: Medicare Other

## 2017-07-09 VITALS — BP 130/70 | HR 60 | Ht 75.0 in | Wt 233.0 lb

## 2017-07-09 DIAGNOSIS — R634 Abnormal weight loss: Secondary | ICD-10-CM | POA: Diagnosis not present

## 2017-07-09 DIAGNOSIS — M5416 Radiculopathy, lumbar region: Secondary | ICD-10-CM | POA: Diagnosis not present

## 2017-07-09 DIAGNOSIS — M2550 Pain in unspecified joint: Secondary | ICD-10-CM

## 2017-07-09 DIAGNOSIS — F1120 Opioid dependence, uncomplicated: Secondary | ICD-10-CM

## 2017-07-09 DIAGNOSIS — E559 Vitamin D deficiency, unspecified: Secondary | ICD-10-CM

## 2017-07-09 LAB — CBC WITH DIFFERENTIAL/PLATELET
BASOS ABS: 0 10*3/uL (ref 0.0–0.1)
Basophils Relative: 0.3 % (ref 0.0–3.0)
EOS PCT: 2 % (ref 0.0–5.0)
Eosinophils Absolute: 0.1 10*3/uL (ref 0.0–0.7)
HCT: 46.1 % (ref 39.0–52.0)
Hemoglobin: 15.4 g/dL (ref 13.0–17.0)
LYMPHS ABS: 1.7 10*3/uL (ref 0.7–4.0)
Lymphocytes Relative: 28.1 % (ref 12.0–46.0)
MCHC: 33.3 g/dL (ref 30.0–36.0)
MCV: 88.9 fl (ref 78.0–100.0)
MONO ABS: 0.5 10*3/uL (ref 0.1–1.0)
MONOS PCT: 8.7 % (ref 3.0–12.0)
NEUTROS ABS: 3.7 10*3/uL (ref 1.4–7.7)
NEUTROS PCT: 60.9 % (ref 43.0–77.0)
PLATELETS: 290 10*3/uL (ref 150.0–400.0)
RBC: 5.18 Mil/uL (ref 4.22–5.81)
RDW: 14 % (ref 11.5–15.5)
WBC: 6 10*3/uL (ref 4.0–10.5)

## 2017-07-09 LAB — TSH: TSH: 2.32 u[IU]/mL (ref 0.35–4.50)

## 2017-07-09 LAB — C-REACTIVE PROTEIN: CRP: 0.2 mg/dL — ABNORMAL LOW (ref 0.5–20.0)

## 2017-07-09 LAB — IBC PANEL
IRON: 88 ug/dL (ref 42–165)
SATURATION RATIOS: 26.5 % (ref 20.0–50.0)
Transferrin: 237 mg/dL (ref 212.0–360.0)

## 2017-07-09 LAB — VITAMIN D 25 HYDROXY (VIT D DEFICIENCY, FRACTURES): VITD: 24.78 ng/mL — ABNORMAL LOW (ref 30.00–100.00)

## 2017-07-09 LAB — URIC ACID: URIC ACID, SERUM: 6.3 mg/dL (ref 4.0–7.8)

## 2017-07-09 LAB — SEDIMENTATION RATE: SED RATE: 25 mm/h — AB (ref 0–20)

## 2017-07-09 LAB — TESTOSTERONE: Testosterone: 307.62 ng/dL (ref 300.00–890.00)

## 2017-07-09 NOTE — Assessment & Plan Note (Signed)
Worsening symptoms with weakness.  Failed all conservative therapy including formal physical therapy.  Home exercise given the last time.  Discussed icing regimen.  Laboratory work-up to make sure there is no other organic causes.  I do feel an MRI is necessary and patient would be a candidate for a epidural.  Patient would not want to have surgical intervention at this point.  Depending on MRI this can change medical management and will call patient afterwards.

## 2017-07-09 NOTE — Patient Instructions (Signed)
Good to see you  Sorry you are hurting so much  We will get labs today downstairs Also will order MRI of the back and see what is going on .  After the MRI I will write you and discuss next step  IF we decide on injection, then I want to see you again 2 weeks AFTER the injection

## 2017-07-13 LAB — PTH, INTACT AND CALCIUM
Calcium: 10 mg/dL (ref 8.6–10.3)
PTH: 30 pg/mL (ref 14–64)

## 2017-07-13 LAB — CALCIUM, IONIZED: CALCIUM ION: 5.4 mg/dL (ref 4.8–5.6)

## 2017-07-13 LAB — CYCLIC CITRUL PEPTIDE ANTIBODY, IGG

## 2017-07-13 LAB — ANA: ANA: NEGATIVE

## 2017-07-13 LAB — ANGIOTENSIN CONVERTING ENZYME: ANGIOTENSIN-CONVERTING ENZYME: 38 U/L (ref 9–67)

## 2017-07-13 LAB — RHEUMATOID FACTOR

## 2017-07-14 ENCOUNTER — Other Ambulatory Visit: Payer: Self-pay | Admitting: Family Medicine

## 2017-07-14 NOTE — Telephone Encounter (Signed)
Refill done.  

## 2017-07-16 ENCOUNTER — Telehealth: Payer: Self-pay | Admitting: Nurse Practitioner

## 2017-07-16 DIAGNOSIS — N529 Male erectile dysfunction, unspecified: Secondary | ICD-10-CM

## 2017-07-16 NOTE — Telephone Encounter (Signed)
Copied from [place] [phone]. Topic: General - Other >> Jul 16, 2017 11:32 AM Keene Breath wrote: Reason for CRM: Patient called requesting  his refill for sildenafil (VIAGRA) 100 MG tablet through the [employer] Patient Assistance program.  [phone].

## 2017-07-17 MED ORDER — SILDENAFIL CITRATE 100 MG PO TABS: 100.0000 mg | ORAL_TABLET | Freq: Every day | ORAL | 2 refills | Status: DC | PRN

## 2017-07-17 NOTE — Telephone Encounter (Signed)
Per last form that was done on 60/02/98 application ios good for1 year. Faxed rx for viagra to [phone].Marland KitchenJohny Chess

## 2017-07-17 NOTE — Telephone Encounter (Signed)
Called to reorder med w/pfizer per system pt has reached the available amount of refills. Will need new order form and rx faxed to [phone]. They are faxing another order form to be completed to fax # [phone].Printed rx had dr. Alain Marion to sign since Hollie Beach is not in the office. Still waiting on order form that's being sent...Alex Hogan

## 2017-07-18 ENCOUNTER — Other Ambulatory Visit: Payer: Medicare Other

## 2017-07-22 ENCOUNTER — Telehealth: Payer: Self-pay | Admitting: Nurse Practitioner

## 2017-07-22 NOTE — Telephone Encounter (Signed)
Copied from [place] [phone]. Topic: Quick Communication - See Telephone Encounter >> Jul 22, 2017  7:06 AM Hewitt Shorts wrote: Pt is needing to get a refill on his hydrocodone [facility] [address]   [place] number [phone]

## 2017-07-22 NOTE — Telephone Encounter (Signed)
Due to legal requirements, I can not refill his hydrocodone until he has a UDS. He was instructed to have UDS done at his last office visit. Please have him come by to haVe UDS drawn today, it will take a few days to get this test result back, and I can not prescribe him any refills until this is back

## 2017-07-22 NOTE — Telephone Encounter (Signed)
Pt calling back about RX hydrocodone I advised him that it could take up to 48-72 hours pt states that she always fill this medicine the same day when he calls

## 2017-07-22 NOTE — Telephone Encounter (Signed)
Pt aware of response below and states he will be in tomorrow morning to give a sample.

## 2017-07-22 NOTE — Telephone Encounter (Signed)
Last refill was 06/22/17 #90 per 30 days.

## 2017-07-23 ENCOUNTER — Other Ambulatory Visit: Payer: Medicare Other

## 2017-07-23 DIAGNOSIS — F1120 Opioid dependence, uncomplicated: Secondary | ICD-10-CM

## 2017-07-24 ENCOUNTER — Ambulatory Visit
Admission: RE | Admit: 2017-07-24 | Discharge: 2017-07-24 | Disposition: A | Discharge status: Home / Self Care | Payer: Medicare Other | Source: Ambulatory Visit | Attending: Family Medicine | Admitting: Family Medicine

## 2017-07-24 DIAGNOSIS — M5416 Radiculopathy, lumbar region: Secondary | ICD-10-CM

## 2017-07-24 DIAGNOSIS — M48061 Spinal stenosis, lumbar region without neurogenic claudication: Secondary | ICD-10-CM | POA: Diagnosis not present

## 2017-07-25 LAB — PAIN MGMT, PROFILE 8 W/CONF, U
6 ACETYLMORPHINE: NEGATIVE ng/mL (ref <10)
ALCOHOL METABOLITES: NEGATIVE ng/mL (ref <500)
Amphetamines: NEGATIVE ng/mL (ref <500)
Benzodiazepines: NEGATIVE ng/mL (ref <100)
Buprenorphine, Urine: NEGATIVE ng/mL (ref <5)
COCAINE METABOLITE: NEGATIVE ng/mL (ref <150)
Codeine: NEGATIVE ng/mL (ref <50)
Creatinine: 180.8 mg/dL
Hydrocodone: 12370 ng/mL — ABNORMAL HIGH (ref <50)
Hydromorphone: NEGATIVE ng/mL (ref <50)
MDMA: NEGATIVE ng/mL (ref <500)
MORPHINE: NEGATIVE ng/mL (ref <50)
Marijuana Metabolite: NEGATIVE ng/mL (ref <20)
NORHYDROCODONE: NEGATIVE ng/mL (ref <50)
OPIATES: POSITIVE ng/mL — AB (ref <100)
Oxidant: NEGATIVE ug/mL (ref <200)
Oxycodone: NEGATIVE ng/mL (ref <100)
pH: 6.4 (ref 4.5–9.0)

## 2017-07-27 ENCOUNTER — Other Ambulatory Visit: Payer: Self-pay

## 2017-07-27 DIAGNOSIS — M545 Low back pain: Secondary | ICD-10-CM

## 2017-07-27 DIAGNOSIS — M549 Dorsalgia, unspecified: Secondary | ICD-10-CM

## 2017-07-27 DIAGNOSIS — G8929 Other chronic pain: Secondary | ICD-10-CM

## 2017-07-27 NOTE — Progress Notes (Signed)
Done

## 2017-07-27 NOTE — Telephone Encounter (Signed)
Patient calling to see if Viagra was mailed to the office yet. Advised he will get a call when it's ready.

## 2017-07-28 NOTE — Telephone Encounter (Signed)
Package has been picked up by patient this morning.

## 2017-08-03 ENCOUNTER — Ambulatory Visit
Admission: RE | Admit: 2017-08-03 | Discharge: 2017-08-03 | Disposition: A | Discharge status: Home / Self Care | Payer: Medicare Other | Source: Ambulatory Visit | Attending: Family Medicine | Admitting: Family Medicine

## 2017-08-03 DIAGNOSIS — M545 Low back pain: Principal | ICD-10-CM

## 2017-08-03 DIAGNOSIS — M48061 Spinal stenosis, lumbar region without neurogenic claudication: Secondary | ICD-10-CM | POA: Diagnosis not present

## 2017-08-03 DIAGNOSIS — G8929 Other chronic pain: Secondary | ICD-10-CM

## 2017-08-03 MED ORDER — METHYLPREDNISOLONE ACETATE 40 MG/ML INJ SUSP (RADIOLOG
120.0000 mg | Freq: Once | INTRAMUSCULAR | Status: AC
  Administered 2017-08-03: 120 mg via EPIDURAL

## 2017-08-03 MED ORDER — IOPAMIDOL (ISOVUE-M 200) INJECTION 41%
1.0000 mL | Freq: Once | INTRAMUSCULAR | Status: AC
  Administered 2017-08-03: 1 mL via EPIDURAL

## 2017-08-03 NOTE — Discharge Instructions (Signed)

## 2017-08-18 ENCOUNTER — Telehealth: Payer: Self-pay | Admitting: Nurse Practitioner

## 2017-08-18 NOTE — Telephone Encounter (Signed)
Spoke with staff at [facility] who states that prescription was picked up on 6/24 for 90 tabs. A refill is available at the pharmacy but will not be able to be refilled until 7/22. Pt called and notified of this information. Pt verbalized understanding.

## 2017-08-18 NOTE — Telephone Encounter (Signed)
Copied from [place] [id number]. Topic: Quick Communication - Rx Refill/Question >> Aug 18, 2017  8:22 AM Synthia Innocent wrote: Medication: HYDROcodone-acetaminophen ([place]) 7.5-325 MG tablet  Has the patient contacted their pharmacy? Yes.   (Agent: If no, request that the patient contact the pharmacy for the refill.) (Agent: If yes, when and what did the pharmacy advise?)  [facility]erred Pharm[facility]with phone number [facility] [address]  Agent: Please be advised that RX refills may take up to 3 business days. We ask that you follow-up with your pharmacy.
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
## 2017-09-10 ENCOUNTER — Ambulatory Visit: Payer: Medicare Other | Admitting: Family Medicine

## 2017-09-21 ENCOUNTER — Ambulatory Visit (INDEPENDENT_AMBULATORY_CARE_PROVIDER_SITE_OTHER): Payer: Medicare Other | Admitting: Nurse Practitioner

## 2017-09-21 ENCOUNTER — Encounter: Payer: Self-pay | Admitting: Nurse Practitioner

## 2017-09-21 VITALS — BP 120/72 | HR 67 | Ht 75.0 in | Wt 240.0 lb

## 2017-09-21 DIAGNOSIS — I10 Essential (primary) hypertension: Secondary | ICD-10-CM | POA: Diagnosis not present

## 2017-09-21 DIAGNOSIS — M5416 Radiculopathy, lumbar region: Secondary | ICD-10-CM | POA: Diagnosis not present

## 2017-09-21 DIAGNOSIS — G8929 Other chronic pain: Secondary | ICD-10-CM | POA: Diagnosis not present

## 2017-09-21 MED ORDER — LOSARTAN POTASSIUM-HCTZ 100-25 MG PO TABS: 1.0000 | ORAL_TABLET | Freq: Every day | ORAL | 2 refills | Status: DC

## 2017-09-21 MED ORDER — HYDROCODONE-ACETAMINOPHEN 7.5-325 MG PO TABS: 1.0000 | ORAL_TABLET | Freq: Three times a day (TID) | ORAL | 0 refills | Status: DC

## 2017-09-21 NOTE — Assessment & Plan Note (Signed)
Again today we discussed the risks of adverse effects of narcotic pain medications including addiction, respiratory depression, accident overdose and that norco is not the safest or best option for chronic pain management,  I highly recommend referral back to pain management to consider alternatives to treat his chronic pain, as he has not seen pain management in years, and he is agreeable to referral today - HYDROcodone-acetaminophen (NORCO) 7.5-325 MG tablet; Take 1 tablet by mouth every 8 (eight) hours.  Dispense: 90 tablet; Refill: 0 - Ambulatory referral to Pain Clinic

## 2017-09-21 NOTE — Assessment & Plan Note (Addendum)
norco refill sent today-dosing and side effects discussed-pt prefers to continue norco at this time but agrees to pain management referral  Controlled substance registry reviewed with no irregularities. UDS up to date. Controlled substances contract update - HYDROcodone-acetaminophen (NORCO) 7.5-325 MG tablet; Take 1 tablet by mouth every 8 (eight) hours.  Dispense: 90 tablet; Refill: 0 - Ambulatory referral to Pain Clinic

## 2017-09-21 NOTE — Assessment & Plan Note (Signed)
Stable Continue current medication Continue to monitor - losartan-hydrochlorothiazide (HYZAAR) 100-25 MG tablet; Take 1 tablet by mouth daily.  Dispense: 90 tablet; Refill: 2
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
## 2017-09-21 NOTE — Patient Instructions (Addendum)
I have placed a referral to pain management. Our office will begin processing this referral. Please follow up if you have not heard anything about this referral within 10 days.  Please return in about 3 months for routine follow up with annual fasting labwork.
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
## 2017-09-29 ENCOUNTER — Ambulatory Visit (INDEPENDENT_AMBULATORY_CARE_PROVIDER_SITE_OTHER): Payer: Medicare Other | Admitting: Family Medicine

## 2017-09-29 ENCOUNTER — Encounter: Payer: Self-pay | Admitting: Family Medicine

## 2017-09-29 VITALS — BP 140/80 | HR 60 | Ht 75.0 in | Wt 237.0 lb

## 2017-09-29 DIAGNOSIS — G8929 Other chronic pain: Secondary | ICD-10-CM

## 2017-09-29 DIAGNOSIS — F1120 Opioid dependence, uncomplicated: Secondary | ICD-10-CM | POA: Diagnosis not present

## 2017-09-29 DIAGNOSIS — M545 Low back pain: Secondary | ICD-10-CM

## 2017-09-29 DIAGNOSIS — M48062 Spinal stenosis, lumbar region with neurogenic claudication: Secondary | ICD-10-CM

## 2017-09-29 DIAGNOSIS — M48061 Spinal stenosis, lumbar region without neurogenic claudication: Secondary | ICD-10-CM | POA: Insufficient documentation

## 2017-09-29 NOTE — Patient Instructions (Addendum)
Good to see you  Alex Hogan is your friend  We will try another injection to help the calf pain  Look into quell as well  Call [phone] to schedule the injection  Once you know when the injection is then make an appointment with me 2-3 weeks later

## 2017-09-29 NOTE — Assessment & Plan Note (Signed)
I believe the patient's leg pain is still secondary to spinal stenosis.  We discussed icing regimen and home exercise.  Discussed which activities to do which wants to avoid.  I believe the patient could respond to another epidural and will improve L5-S1.  Hopefully this will be beneficial.  Differential includes calf injury but patient on exam is nontender to palpation.  Patient has no real signs of being compliant with him only hurting when standing and gets better with rest.  We will see how patient responds to another injection and follow-up 2 to 3 weeks later

## 2017-09-29 NOTE — Assessment & Plan Note (Signed)
Difficult to treat further secondary to chronic opiate use.

## 2017-09-30 ENCOUNTER — Telehealth: Payer: Self-pay | Admitting: *Deleted

## 2017-09-30 DIAGNOSIS — M5416 Radiculopathy, lumbar region: Secondary | ICD-10-CM

## 2017-09-30 NOTE — Telephone Encounter (Signed)
Epidural sent to [facility].  Pt made aware.

## 2017-09-30 NOTE — Telephone Encounter (Signed)
Copied from [place] [id number]. Topic: Referral - Request >> Sep 30, 2017 10:51 AM Ivar Drape wrote: Reason for CRM:  Patient was in to see Dr. Hulan Saas yesterday, Aug 27th and was told to call [facility] to schedule an Epidural Injection, but after calling them he found out the order is not in the system.  Please put the order in the system so the appt can be scheduled.

## 2017-10-14 ENCOUNTER — Ambulatory Visit
Admission: RE | Admit: 2017-10-14 | Discharge: 2017-10-14 | Disposition: A | Discharge status: Home / Self Care | Payer: Medicare Other | Source: Ambulatory Visit | Attending: Family Medicine | Admitting: Family Medicine

## 2017-10-14 DIAGNOSIS — M5416 Radiculopathy, lumbar region: Secondary | ICD-10-CM

## 2017-10-14 DIAGNOSIS — M545 Low back pain: Secondary | ICD-10-CM | POA: Diagnosis not present

## 2017-10-14 MED ORDER — METHYLPREDNISOLONE ACETATE 40 MG/ML INJ SUSP (RADIOLOG
120.0000 mg | Freq: Once | INTRAMUSCULAR | Status: AC
  Administered 2017-10-14: 120 mg via EPIDURAL

## 2017-10-14 MED ORDER — IOPAMIDOL (ISOVUE-M 200) INJECTION 41%
1.0000 mL | Freq: Once | INTRAMUSCULAR | Status: AC
  Administered 2017-10-14: 1 mL via EPIDURAL

## 2017-10-14 NOTE — Discharge Instructions (Signed)

## 2017-10-16 ENCOUNTER — Telehealth: Payer: Self-pay | Admitting: Nurse Practitioner

## 2017-10-16 NOTE — Telephone Encounter (Signed)
I requested Viagra 100 mg # 30 refill for pt. Patient has 1 remaining refill on file. Can be filled next on 12/23/17.  This shipment will be mailed in 7-10 days. Conf #: [id number] Pt informed

## 2017-10-16 NOTE — Telephone Encounter (Signed)
Copied from [place] [id number]. Topic: Quick Communication - Rx Refill/Question >> Oct 16, 2017  8:09 AM Scherrie Gerlach wrote: Medication: sildenafil (VIAGRA) 100 MG tablet  Pt states it is time for the dr to order this from [employer]. He is on the assistance program for this med

## 2017-10-20 ENCOUNTER — Telehealth: Payer: Self-pay | Admitting: Nurse Practitioner

## 2017-10-20 DIAGNOSIS — G8929 Other chronic pain: Secondary | ICD-10-CM

## 2017-10-20 DIAGNOSIS — M5416 Radiculopathy, lumbar region: Secondary | ICD-10-CM

## 2017-10-20 NOTE — Telephone Encounter (Signed)
Copied from [place] [id number]. Topic: Quick Communication - Rx Refill/Question >> Oct 20, 2017  3:54 PM Alex Hogan, Alex Hogan wrote: Medication:HYDROcodone-acetaminophen (Standard) 7.5-325 MG tablet  Has the patient contacted their pharmacy? No  Preferred Pharmacy (with phone number or street name): [id number] [address], [place] [phone] (Phone) [phone] (Fax)    Agent: Please be advised that RX refills may take up to 3 business days. We ask that you follow-up with your pharmacy.

## 2017-10-21 ENCOUNTER — Telehealth: Payer: Self-pay | Admitting: Physical Medicine & Rehabilitation

## 2017-10-21 MED ORDER — HYDROCODONE-ACETAMINOPHEN 7.5-325 MG PO TABS: 1.0000 | ORAL_TABLET | Freq: Three times a day (TID) | ORAL | 0 refills | Status: DC

## 2017-10-21 NOTE — Telephone Encounter (Signed)
Refill sent.

## 2017-10-21 NOTE — Telephone Encounter (Signed)
 Controlled Substance Database checked. Last filled on 09/21/17  Last OV 09/21/17 Next OV 12/23/17

## 2017-10-29 NOTE — Telephone Encounter (Signed)
Pt calling to see if someone checked the mail today so that he can come pick up his RX that came in the mail yesterday evening please call him at [phone]

## 2017-10-29 NOTE — Telephone Encounter (Signed)
Pt notified we have meds here in office.

## 2017-11-18 ENCOUNTER — Telehealth: Payer: Self-pay | Admitting: Nurse Practitioner

## 2017-11-18 DIAGNOSIS — M5416 Radiculopathy, lumbar region: Secondary | ICD-10-CM

## 2017-11-18 DIAGNOSIS — G8929 Other chronic pain: Secondary | ICD-10-CM

## 2017-11-18 MED ORDER — HYDROCODONE-ACETAMINOPHEN 7.5-325 MG PO TABS: 1.0000 | ORAL_TABLET | Freq: Three times a day (TID) | ORAL | 0 refills | Status: DC

## 2017-11-18 NOTE — Telephone Encounter (Signed)
Copied from [place] [phone]. Topic: Quick Communication - Rx Refill/Question >> Nov 18, 2017 10:38 AM Reyne Dumas L wrote: Medication: HYDROcodone-acetaminophen (NORCO) 7.5-325 MG tablet  Has the patient contacted their pharmacy? No - controlled substance (Agent: If no, request that the patient contact the pharmacy for the refill.) (Agent: If yes, when and what did the pharmacy advise?)  Preferred Pharmacy (with phone number or street name): [id number] [address], [place] [phone] (Phone) [phone] (Fax)  Agent: Please be advised that RX refills may take up to 3 business days. We ask that you follow-up with your pharmacy.

## 2017-11-18 NOTE — Telephone Encounter (Signed)
MD approved and sent electronically to [facility]  

## 2017-11-18 NOTE — Telephone Encounter (Signed)
Check [place] last filled 10/22/2017.Marland KitchenJohny Chess

## 2017-11-18 NOTE — Telephone Encounter (Signed)
Refill sent.
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
## 2017-12-15 ENCOUNTER — Ambulatory Visit: Payer: Medicare Other | Admitting: Family Medicine

## 2017-12-23 ENCOUNTER — Encounter: Payer: Self-pay | Admitting: Nurse Practitioner

## 2017-12-23 ENCOUNTER — Ambulatory Visit (INDEPENDENT_AMBULATORY_CARE_PROVIDER_SITE_OTHER): Payer: Medicare Other | Admitting: Nurse Practitioner

## 2017-12-23 VITALS — BP 120/78 | HR 70 | Ht 75.0 in | Wt 239.0 lb

## 2017-12-23 DIAGNOSIS — G8929 Other chronic pain: Secondary | ICD-10-CM | POA: Diagnosis not present

## 2017-12-23 DIAGNOSIS — N529 Male erectile dysfunction, unspecified: Secondary | ICD-10-CM

## 2017-12-23 DIAGNOSIS — M5416 Radiculopathy, lumbar region: Secondary | ICD-10-CM

## 2017-12-23 DIAGNOSIS — Z9189 Other specified personal risk factors, not elsewhere classified: Secondary | ICD-10-CM

## 2017-12-23 DIAGNOSIS — Z0000 Encounter for general adult medical examination without abnormal findings: Secondary | ICD-10-CM | POA: Diagnosis not present

## 2017-12-23 DIAGNOSIS — I10 Essential (primary) hypertension: Secondary | ICD-10-CM | POA: Diagnosis not present

## 2017-12-23 DIAGNOSIS — Z125 Encounter for screening for malignant neoplasm of prostate: Secondary | ICD-10-CM

## 2017-12-23 DIAGNOSIS — Z114 Encounter for screening for human immunodeficiency virus [HIV]: Secondary | ICD-10-CM | POA: Diagnosis not present

## 2017-12-23 DIAGNOSIS — Z23 Encounter for immunization: Secondary | ICD-10-CM | POA: Diagnosis not present

## 2017-12-23 DIAGNOSIS — Z1159 Encounter for screening for other viral diseases: Secondary | ICD-10-CM

## 2017-12-23 DIAGNOSIS — Z13220 Encounter for screening for lipoid disorders: Secondary | ICD-10-CM

## 2017-12-23 MED ORDER — HYDROCODONE-ACETAMINOPHEN 7.5-325 MG PO TABS: 1.0000 | ORAL_TABLET | Freq: Three times a day (TID) | ORAL | 0 refills | Status: DC

## 2017-12-23 MED ORDER — HYDROCODONE-ACETAMINOPHEN 7.5-325 MG PO TABS
1.0000 | ORAL_TABLET | Freq: Three times a day (TID) | ORAL | 0 refills | Status: DC
Start: 2017-12-23 — End: 2018-01-06

## 2017-12-23 NOTE — Assessment & Plan Note (Signed)
Stable Continue viagra prn - Comprehensive metabolic panel; Future - PSA, Medicare; Future

## 2017-12-23 NOTE — Assessment & Plan Note (Addendum)
Need for influenza vaccination- Flu Vaccine QUAD 36+ mos IM Encounter for hepatitis C virus screening test for high risk patient- Hepatitis C antibody; Future Screening for cholesterol level- Lipid panel; Future Screening for HIV (human immunodeficiency virus)- HIV Antibody (routine testing w rflx); Future Screening for prostate cancer- PSA, Medicare; Future  He is going to return for labs when fasting

## 2017-12-23 NOTE — Assessment & Plan Note (Signed)
Again today we discussed the risks of adverse effects of narcotic pain medications, he would like to continue for now He is actively following up SM, recommendations, and today tells me he has found a pain clinic he might be interested in going to, we can discuss this again at his next OV Controlled substance registry reviewed with no irregularities. UDS up to date. Controlled substance contract up to date - HYDROcodone-acetaminophen ([place]) 7.5-325 MG tablet; Take 1 tablet by mouth every 8 (eight) hours. Refill on or after 12/23/17  Dispense: 90 tablet; Refill: 0 - HYDROcodone-acetaminophen (NORCO) 7.5-325 MG tablet; Take 1 tablet by mouth every 8 (eight) hours. Fill on or after 01/21/18  Dispense: 30 tablet; Refill: 0 - HYDROcodone-acetaminophen (NORCO) 7.5-325 MG tablet; Take 1 tablet by mouth every 8 (eight) hours. Fill on or after 02/20/2018  Dispense: 30 tablet; Refill: 0

## 2017-12-23 NOTE — Patient Instructions (Signed)
Return for labs when fasting  I will see you in 3 months

## 2017-12-23 NOTE — Assessment & Plan Note (Signed)
Again today we discussed the risks of adverse effects of narcotic pain medications, he would like to continue for now He is actively following up SM, recommendations, and today tells me he has found a pain clinic he might be interested in going to, we can discuss this again at his next OV Controlled substance registry reviewed with no irregularities. UDS up to date. Controlled substance contract up to date - HYDROcodone-acetaminophen ([place]) 7.5-325 MG tablet; Take 1 tablet by mouth every 8 (eight) hours. Refill on or after 12/23/17  Dispense: 90 tablet; Refill: 0 - HYDROcodone-acetaminophen (NORCO) 7.5-325 MG tablet; Take 1 tablet by mouth every 8 (eight) hours. Fill on or after 01/21/18  Dispense: 30 tablet; Refill: 0 - HYDROcodone-acetaminophen (NORCO) 7.5-325 MG tablet; Take 1 tablet by mouth every 8 (eight) hours. Fill on or after 02/20/2018  Dispense: 30 tablet; Refill: 0 - Comprehensive metabolic panel; Future

## 2017-12-23 NOTE — Assessment & Plan Note (Signed)
Stable Continue losartan-hctz Update labs - Comprehensive metabolic panel; Future
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
## 2017-12-31 ENCOUNTER — Other Ambulatory Visit: Payer: Self-pay | Admitting: Nurse Practitioner

## 2017-12-31 DIAGNOSIS — I10 Essential (primary) hypertension: Secondary | ICD-10-CM
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
## 2018-01-06 ENCOUNTER — Ambulatory Visit (INDEPENDENT_AMBULATORY_CARE_PROVIDER_SITE_OTHER): Payer: Medicare Other | Admitting: Family Medicine

## 2018-01-06 ENCOUNTER — Encounter: Payer: Self-pay | Admitting: Family Medicine

## 2018-01-06 VITALS — BP 128/68 | HR 65 | Ht 75.0 in | Wt 237.0 lb

## 2018-01-06 DIAGNOSIS — M5416 Radiculopathy, lumbar region: Secondary | ICD-10-CM

## 2018-01-06 DIAGNOSIS — M549 Dorsalgia, unspecified: Secondary | ICD-10-CM

## 2018-01-06 DIAGNOSIS — G8929 Other chronic pain: Secondary | ICD-10-CM

## 2018-01-06 NOTE — Assessment & Plan Note (Signed)
Continue lumbar radiculopathies with patient having worsening symptoms.  Now has weakness noted as well as decreased deep tendon reflexes.  Failed conservative therapy including formal physical therapy that he has had at an outside facility.  Patient has been having this pain and seems to be worsening.  Could be a candidate for epidural steroid injections.  MRI ordered and then will discuss the possibility of epidurals depending on findings.  Patient given a temporary handicap sticker today.  Follow-up again after imaging.  Spent  25 minutes with patient face-to-face and had greater than 50% of counseling including as described above in assessment and plan.

## 2018-01-06 NOTE — Patient Instructions (Signed)
Srry not a lot better MRI ordered Either stop y or call [phone] and schedule Once I have the results we will discuss injections Happy holidays!  IF we do the injections then I want to see you again 3 weeks AFTER

## 2018-01-10 ENCOUNTER — Ambulatory Visit
Admission: RE | Admit: 2018-01-10 | Discharge: 2018-01-10 | Disposition: A | Discharge status: Home / Self Care | Payer: Medicare Other | Source: Ambulatory Visit | Attending: Family Medicine | Admitting: Family Medicine

## 2018-01-10 DIAGNOSIS — G8929 Other chronic pain: Secondary | ICD-10-CM

## 2018-01-10 DIAGNOSIS — M549 Dorsalgia, unspecified: Principal | ICD-10-CM

## 2018-01-11 ENCOUNTER — Ambulatory Visit
Admission: RE | Admit: 2018-01-11 | Discharge: 2018-01-11 | Disposition: A | Discharge status: Home / Self Care | Payer: Medicare Other | Source: Ambulatory Visit | Attending: Family Medicine | Admitting: Family Medicine

## 2018-01-11 DIAGNOSIS — M48061 Spinal stenosis, lumbar region without neurogenic claudication: Secondary | ICD-10-CM | POA: Diagnosis not present

## 2018-01-12 ENCOUNTER — Telehealth: Payer: Self-pay

## 2018-01-12 ENCOUNTER — Other Ambulatory Visit: Payer: Self-pay

## 2018-01-12 DIAGNOSIS — G8929 Other chronic pain: Secondary | ICD-10-CM

## 2018-01-12 DIAGNOSIS — M545 Low back pain: Principal | ICD-10-CM

## 2018-01-12 NOTE — Telephone Encounter (Signed)
-----   Message from Lyndal Pulley, DO sent at 01/11/2018  8:23 PM EST ----- Severe arthritis. Severe spinal stenosis.  Need epidural L4/5 Call patient and tell him and if he agrees then please put in order

## 2018-01-12 NOTE — Telephone Encounter (Signed)
Talked to patient and ordered epidural. [facility] will call to schedule.

## 2018-01-17 ENCOUNTER — Other Ambulatory Visit: Payer: Medicare Other

## 2018-01-18 ENCOUNTER — Telehealth: Payer: Self-pay | Admitting: Nurse Practitioner

## 2018-01-18 DIAGNOSIS — M5416 Radiculopathy, lumbar region: Secondary | ICD-10-CM

## 2018-01-18 DIAGNOSIS — G8929 Other chronic pain: Secondary | ICD-10-CM

## 2018-01-18 DIAGNOSIS — I10 Essential (primary) hypertension: Secondary | ICD-10-CM

## 2018-01-18 MED ORDER — HYDROCODONE-ACETAMINOPHEN 7.5-325 MG PO TABS: 1.0000 | ORAL_TABLET | Freq: Three times a day (TID) | ORAL | 0 refills | Status: DC

## 2018-01-18 MED ORDER — LOSARTAN POTASSIUM-HCTZ 100-25 MG PO TABS: 1.0000 | ORAL_TABLET | Freq: Every day | ORAL | 1 refills | Status: DC

## 2018-01-18 NOTE — Telephone Encounter (Signed)
Copied from [place] [phone]. Topic: Quick Communication - Rx Refill/Question >> Jan 18, 2018  2:49 PM Blase Mess A wrote: Medication: HYDROcodone-acetaminophen ([place]CO) 7.5-325 MG tablet [[id number]] losartan-hydrochlorothiazide (HYZAAR) 100-25 MG tablet [[id number]]   Has the patient contacted their pharmacy? Yes (Agent: If no, request that the patient contact the pharmacy for the refill.) (Agent: If yes, when and what did the pharmacy advise?)  Preferred Pharmacy (with phone number or street name): [id number] [address], [place] [address] Phone: [phone] Fax: [phone] Not a 24 hour pharmacy; exact hours not known.    Agent: Please be advised that RX refills may take up to 3 business days. We ask that you follow-up with your pharmacy.

## 2018-01-18 NOTE — Telephone Encounter (Signed)
[place] Controlled Database Checked Last filled: 12/23/17 # 90 LOV w/you: 12/23/17 Next appt w/you: 03/24/18

## 2018-01-19 ENCOUNTER — Ambulatory Visit
Admission: RE | Admit: 2018-01-19 | Discharge: 2018-01-19 | Disposition: A | Discharge status: Home / Self Care | Payer: Medicare Other | Source: Ambulatory Visit | Attending: Family Medicine | Admitting: Family Medicine

## 2018-01-19 DIAGNOSIS — M545 Low back pain, unspecified: Secondary | ICD-10-CM

## 2018-01-19 DIAGNOSIS — G8929 Other chronic pain: Secondary | ICD-10-CM

## 2018-01-19 DIAGNOSIS — M48061 Spinal stenosis, lumbar region without neurogenic claudication: Secondary | ICD-10-CM | POA: Diagnosis not present

## 2018-01-19 MED ORDER — IOPAMIDOL (ISOVUE-M 200) INJECTION 41%
1.0000 mL | Freq: Once | INTRAMUSCULAR | Status: AC
  Administered 2018-01-19: 1 mL via EPIDURAL

## 2018-01-19 MED ORDER — METHYLPREDNISOLONE ACETATE 40 MG/ML INJ SUSP (RADIOLOG
120.0000 mg | Freq: Once | INTRAMUSCULAR | Status: AC
  Administered 2018-01-19: 120 mg via EPIDURAL

## 2018-01-19 NOTE — Discharge Instructions (Signed)

## 2018-01-21 ENCOUNTER — Telehealth: Payer: Self-pay | Admitting: Nurse Practitioner

## 2018-01-21 DIAGNOSIS — G8929 Other chronic pain: Secondary | ICD-10-CM

## 2018-01-21 DIAGNOSIS — M5416 Radiculopathy, lumbar region: Secondary | ICD-10-CM

## 2018-01-21 NOTE — Telephone Encounter (Signed)
Copied from [place] [phone]. Topic: General - Other >> Jan 21, 2018  9:58 AM Keene Breath wrote: Reason for CRM: Patient called to inform the doctor that when he went to pick up his medication for HYDROcodone-acetaminophen ([place]) 7.5-325 MG tablet, it was only for 30 days and should have been for 90 days.  Pharmacy gave him the meds. And said he could come back in 10 days to get the additional 60 days, but the doctor would have to write the subscription.  Apparently it was a mistake.  Please advise and call patient back if there are any questions.  CB# [phone]

## 2018-01-21 NOTE — Telephone Encounter (Signed)
Duplicate message. See 01/21/18 tele encounter.

## 2018-01-21 NOTE — Telephone Encounter (Signed)
Patient is calling to ask why his medication was only for a 30 day supply.  He stated he gets 90 day supply. Please advise

## 2018-01-22 MED ORDER — HYDROCODONE-ACETAMINOPHEN 7.5-325 MG PO TABS: 1.0000 | ORAL_TABLET | Freq: Three times a day (TID) | ORAL | 0 refills | Status: DC

## 2018-01-22 NOTE — Telephone Encounter (Signed)
Will send additional 60 pills for this month, Will cancel 1/18 script for #30 and resend for #90

## 2018-01-22 NOTE — Telephone Encounter (Signed)
Pharmacy informed to cancel 12/23/17 Rx for fill date 02/20/18 # 46.

## 2018-01-25 ENCOUNTER — Other Ambulatory Visit: Payer: Self-pay | Admitting: Nurse Practitioner

## 2018-01-25 DIAGNOSIS — I10 Essential (primary) hypertension: Secondary | ICD-10-CM

## 2018-01-25 NOTE — Telephone Encounter (Signed)
Copied from [place] [phone]. Topic: Quick Communication - Rx Refill/Question >> Jan 25, 2018  9:25 AM Bea Graff, NT wrote: Medication: losartan-hydrochlorothiazide (HYZAAR) 100-25 MG tablet  Has the patient contacted their pharmacy? Yes.   (Agent: If no, request that the patient contact the pharmacy for the refill.) (Agent: If yes, when and what did the pharmacy advise?)  Preferred Pharmacy (with phone number or street name): [place], [place] [phone] (Phone) [phone] (Fax)    Agent: Please be advised that RX refills may take up to 3 business days. We ask that you follow-up with your pharmacy.

## 2018-01-28 MED ORDER — HYDROCHLOROTHIAZIDE 25 MG PO TABS: 25.0000 mg | ORAL_TABLET | Freq: Every day | ORAL | 0 refills | Status: DC

## 2018-01-28 MED ORDER — LOSARTAN POTASSIUM 100 MG PO TABS: 100.0000 mg | ORAL_TABLET | Freq: Every day | ORAL | 0 refills | Status: DC

## 2018-01-28 NOTE — Telephone Encounter (Signed)
[facility] stated [facility] [address] [facility] that his losartan-hydrochlorothiazide (HYZAAR) 100-25 MG tablet medication is on a nationwide back order.  He is completely out of his medication now.  So he would like two prescriptions sent in to split up this medication.

## 2018-01-28 NOTE — Telephone Encounter (Signed)
Yes- it is fine to split into 2; sending in prescriptions to pharmacy on file.

## 2018-01-28 NOTE — Telephone Encounter (Signed)
Pt informed of below.  

## 2018-01-28 NOTE — Telephone Encounter (Signed)
Ok to split? Please advise in PCP's absence. Thanks!

## 2018-01-28 NOTE — Telephone Encounter (Signed)
Patient called back regarding the Rx for HYDROcodone-acetaminophen ([place]) 7.5-325 MG tablet he was informed that the issue has been addressed by Caesar Chestnut and noted in his chart.

## 2018-01-28 NOTE — Addendum Note (Signed)
Addended by: Sherlene Shams on: 01/28/2018 01:21 PM   Modules accepted: Orders

## 2018-01-28 NOTE — Telephone Encounter (Signed)
[facility] [place] losartan-hydrochlorothiazide (HYZAAR) 100-25 MG tablet medication is on a nationwide back order.  He is completely out of his medication now.  Pt requesting two prescriptions sent in to split up this medication.

## 2018-02-19 ENCOUNTER — Other Ambulatory Visit: Payer: Self-pay | Admitting: Nurse Practitioner

## 2018-02-19 DIAGNOSIS — G8929 Other chronic pain: Secondary | ICD-10-CM

## 2018-02-19 DIAGNOSIS — M5416 Radiculopathy, lumbar region: Secondary | ICD-10-CM

## 2018-02-19 NOTE — Telephone Encounter (Signed)
[place] Controlled Database Checked Last filled: 01/30/18 # 60 LOV w/you: 12/23/17 Next appt w/you: 03/24/18

## 2018-02-19 NOTE — Telephone Encounter (Signed)
Copied from [place]. Topic: Quick Communication - Rx Refill/Question >> Feb 19, 2018 11:36 AM Leward Quan A wrote: Medication: HYDROcodone-acetaminophen (NORCO) 7.5-325 MG tablet   Per patient last refill was sent 01/22/18 he received 30 tabs so new Rx sent on 01/28/18 was with the other 60 tabs that was short. Should be originally 90 tabs. Refill due 02/21/2018   Has the patient contacted their pharmacy? Yes.   (Agent: If no, request that the patient contact the pharmacy for the refill.) (Agent: If yes, when and what did the pharmacy advise?)  Preferred Pharmacy (with phone number or street name): [id number] [address], [place] [phone] (Phone) [phone] (Fax)    Agent: Please be advised that RX refills may take up to 3 business days. We ask that you follow-up with your pharmacy.

## 2018-02-22 NOTE — Telephone Encounter (Signed)
I called pt- he states the pharmacy first filled # 30. He has since received # 54, so he is on schedule and good with his refills. He is scheduled with PCP on 03/24/18.

## 2018-03-24 ENCOUNTER — Encounter: Payer: Self-pay | Admitting: Nurse Practitioner

## 2018-03-24 ENCOUNTER — Ambulatory Visit (INDEPENDENT_AMBULATORY_CARE_PROVIDER_SITE_OTHER): Payer: Medicare Other | Admitting: Nurse Practitioner

## 2018-03-24 ENCOUNTER — Other Ambulatory Visit: Payer: Self-pay | Admitting: *Deleted

## 2018-03-24 DIAGNOSIS — M5416 Radiculopathy, lumbar region: Secondary | ICD-10-CM

## 2018-03-24 DIAGNOSIS — N529 Male erectile dysfunction, unspecified: Secondary | ICD-10-CM

## 2018-03-24 DIAGNOSIS — G8929 Other chronic pain: Secondary | ICD-10-CM

## 2018-03-24 MED ORDER — HYDROCODONE-ACETAMINOPHEN 7.5-325 MG PO TABS: 1.0000 | ORAL_TABLET | Freq: Four times a day (QID) | ORAL | 0 refills | Status: DC | PRN

## 2018-03-24 MED ORDER — SILDENAFIL CITRATE 100 MG PO TABS: 100.0000 mg | ORAL_TABLET | Freq: Every day | ORAL | 2 refills | Status: DC | PRN

## 2018-03-24 MED ORDER — HYDROCODONE-ACETAMINOPHEN 7.5-325 MG PO TABS: 1.0000 | ORAL_TABLET | Freq: Three times a day (TID) | ORAL | 0 refills | Status: DC
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
## 2018-03-24 NOTE — Patient Instructions (Addendum)
Please let me know the name of the pain specialist, and I will place referral for you  Arthritis Arthritis means joint pain. It can also mean joint disease. A joint is a place where bones come together. People who have arthritis may have:  Red joints.  Swollen joints.  Stiff joints.  Warm joints.  A fever.  A feeling of being sick. Follow these instructions at home: Pay attention to any changes in your symptoms. Take these actions to help with your pain and swelling. Medicines  Take over-the-counter and prescription medicines only as told by your doctor.  Do not take aspirin for pain if your doctor says that you may have gout. Activity  Rest your joint if your doctor tells you to.  Avoid activities that make the pain worse.  Exercise your joint regularly as told by your doctor. Try doing exercises like: ? Swimming. ? Water aerobics. ? Biking. ? Walking. Joint Care   If your joint is swollen, keep it raised (elevated) if told by your doctor.  If your joint feels stiff in the morning, try taking a warm shower.  If you have diabetes, do not apply heat without asking your doctor.  If told, apply heat to the joint: ? Put a towel between the joint and the hot pack or heating pad. ? Leave the heat on the area for 20-30 minutes.  If told, apply ice to the joint: ? Put ice in a plastic bag. ? Place a towel between your skin and the bag. ? Leave the ice on for 20 minutes, 2-3 times per day.  Keep all follow-up visits as told by your doctor. Contact a doctor if:  The pain gets worse.  You have a fever. Get help right away if:  You have very bad pain in your joint.  You have swelling in your joint.  Your joint is red.  Many joints become painful and swollen.  You have very bad back pain.  Your leg is very weak.  You cannot control your pee (urine) or poop (stool). This information is not intended to replace advice given to you by your health care provider.  Make sure you discuss any questions you have with your health care provider. Document Released: 04/16/2009 Document Revised: 06/28/2015 Document Reviewed: 04/17/2014 Elsevier Interactive Patient Education  2019 [employer].

## 2018-04-08 ENCOUNTER — Ambulatory Visit: Payer: Medicare Other | Admitting: Family Medicine
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
## 2018-04-12 ENCOUNTER — Ambulatory Visit (INDEPENDENT_AMBULATORY_CARE_PROVIDER_SITE_OTHER): Payer: Medicare Other | Admitting: Family Medicine

## 2018-04-12 ENCOUNTER — Encounter: Payer: Self-pay | Admitting: Family Medicine

## 2018-04-12 VITALS — BP 124/70 | HR 72 | Ht 75.0 in | Wt 228.0 lb

## 2018-04-12 DIAGNOSIS — M7061 Trochanteric bursitis, right hip: Secondary | ICD-10-CM

## 2018-04-12 DIAGNOSIS — M48062 Spinal stenosis, lumbar region with neurogenic claudication: Secondary | ICD-10-CM | POA: Diagnosis not present

## 2018-04-12 DIAGNOSIS — M5416 Radiculopathy, lumbar region: Secondary | ICD-10-CM

## 2018-04-12 NOTE — Assessment & Plan Note (Signed)
Patient given injection.  Tolerated the procedure well.  Discussed icing regimen and home exercise, which activities to do which wants to avoid.  Patient is to increase activity slowly over the course of neck several days.  Follow-up again in 4 to 8 weeks

## 2018-04-12 NOTE — Patient Instructions (Signed)
Good to see you  Tried an injection in the bursae on the side of the hip and should help some of the pain  We ordered the same epidural as well at [phone] pennsaid pinkie amount topically 2 times daily as needed on the side of the hip  Once you have the injection in the back then see me again 3 weeks AFTER the injection  Enjoy the grand babies

## 2018-04-12 NOTE — Assessment & Plan Note (Signed)
Severe multiple levels.  Did have an epidural 3 months ago and was pain-free until the last several weeks.  Starting to have worsening pain again.  Discussed with patient in great length about icing regimen, home exercise, which activities to do which wants to avoid.  Patient is to increase activity slowly over the course of next several days.  Follow-up with me again in 4 to 8 weeks

## 2018-04-15 ENCOUNTER — Other Ambulatory Visit: Payer: Self-pay

## 2018-04-15 ENCOUNTER — Ambulatory Visit
Admission: RE | Admit: 2018-04-15 | Discharge: 2018-04-15 | Disposition: A | Discharge status: Home / Self Care | Payer: Medicare Other | Source: Ambulatory Visit | Attending: Family Medicine | Admitting: Family Medicine

## 2018-04-15 DIAGNOSIS — M5416 Radiculopathy, lumbar region: Secondary | ICD-10-CM

## 2018-04-20 ENCOUNTER — Telehealth: Payer: Self-pay | Admitting: Nurse Practitioner

## 2018-04-20 NOTE — Telephone Encounter (Signed)
Patient called and advised there are refills at [facility] for Hydrocodone, he verbalized understanding.   He asks about the letter he received with Caesar Chestnut, NP leaving, he says he wants to stay at [facility] and he has an appointment on 5/18. I advised another provider, Wilfred Lacy, NP will be coming to [facility] to see [name] patients only on Wednesday's and Friday's until a new provider is hired to take over [name] patient's, he verbalized understanding. Appointment rescheduled with [facility] for Wednesday, 06/23/18 at 0930.

## 2018-04-20 NOTE — Telephone Encounter (Signed)
Copied from [place] [id number]. Topic: General - Other >> Apr 20, 2018  7:16 AM Lennox Solders wrote: Reason for CRM: pt is calling and he needs refill on hydrocodone. [facility] [address]

## 2018-04-21 ENCOUNTER — Other Ambulatory Visit: Payer: Self-pay | Admitting: Family

## 2018-04-22 ENCOUNTER — Other Ambulatory Visit: Payer: Medicare Other

## 2018-05-20 ENCOUNTER — Telehealth: Payer: Self-pay | Admitting: Nurse Practitioner

## 2018-05-20 ENCOUNTER — Ambulatory Visit (INDEPENDENT_AMBULATORY_CARE_PROVIDER_SITE_OTHER): Payer: Medicare Other | Admitting: Internal Medicine

## 2018-05-20 ENCOUNTER — Other Ambulatory Visit: Payer: Self-pay | Admitting: Nurse Practitioner

## 2018-05-20 ENCOUNTER — Encounter: Payer: Self-pay | Admitting: Internal Medicine

## 2018-05-20 DIAGNOSIS — M7061 Trochanteric bursitis, right hip: Secondary | ICD-10-CM

## 2018-05-20 DIAGNOSIS — F1120 Opioid dependence, uncomplicated: Secondary | ICD-10-CM | POA: Diagnosis not present

## 2018-05-20 DIAGNOSIS — M5416 Radiculopathy, lumbar region: Secondary | ICD-10-CM

## 2018-05-20 DIAGNOSIS — G8929 Other chronic pain: Secondary | ICD-10-CM | POA: Diagnosis not present

## 2018-05-20 DIAGNOSIS — R52 Pain, unspecified: Secondary | ICD-10-CM | POA: Diagnosis not present

## 2018-05-20 MED ORDER — HYDROCODONE-ACETAMINOPHEN 7.5-325 MG PO TABS: 1.0000 | ORAL_TABLET | Freq: Three times a day (TID) | ORAL | 0 refills | Status: DC

## 2018-05-20 NOTE — Telephone Encounter (Signed)
Copied from [place] [id number]. Topic: Quick Communication - Rx Refill/Question >> May 20, 2018  8:55 AM Virl Axe D wrote: Medication: HYDROcodone-acetaminophen (NORCO) 7.5-325 MG tablet   Has the patient contacted their pharmacy? Yes.   (Agent: If no, request that the patient contact the pharmacy for the refill.) (Agent: If yes, when and what did the pharmacy advise?)  Preferred Pharmacy (with phone number or street name): [id number] [address], [place] [phone] (Phone) [phone] (Fax)    Agent: Please be advised that RX refills may take up to 3 business days. We ask that you follow-up with your pharmacy.

## 2018-05-20 NOTE — Telephone Encounter (Signed)
Duplicate request. Pt have virtual appt w/dr. Jenny Reichmann this afternoon...Alex Hogan

## 2018-05-20 NOTE — Telephone Encounter (Signed)
Should have virtual visit for refill of controlled
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
## 2018-05-20 NOTE — Assessment & Plan Note (Signed)
Improved, f/u Dr Tamala Julian as planned

## 2018-05-20 NOTE — Assessment & Plan Note (Deleted)
Improved, f/u Dr Tamala Julian as planned

## 2018-05-20 NOTE — Assessment & Plan Note (Signed)
D/w pt options include pain management if continues to require opiate

## 2018-05-20 NOTE — Telephone Encounter (Signed)
Requested medication (s) are due for refill today: yes  Requested medication (s) are on the active medication list: yes  Last refill:  03/24/18  Future visit scheduled: yes  Notes to clinic:  Medication not delegated to NT to refill   Requested Prescriptions  Pending Prescriptions Disp Refills   HYDROcodone-acetaminophen (NORCO) 7.5-325 MG tablet 90 tablet 0    Sig: Take 1 tablet by mouth every 8 (eight) hours. Fill on or after 03/24/2018     Not Delegated - Analgesics:  Opioid Agonist Combinations Failed - 05/20/2018  8:55 AM      Failed - This refill cannot be delegated      Passed - Urine Drug Screen completed in last 360 days.      Passed - Valid encounter within last 6 months    Recent Outpatient Visits          1 month ago Lumbar radiculopathy   [place], [place], DO   1 month ago Lumbar radiculitis   [place], Delphia Grates, NP   4 months ago Chronic back pain, unspecified back location, unspecified back pain laterality   [place], [place], DO   4 months ago Other chronic pain   [place], Delphia Grates, NP   7 months ago Chronic low back pain, unspecified back pain laterality, with sciatica presence unspecified   [place], [place], DO      Future Appointments            In 1 month Nche, Charlene Brooke, NP [place], [place]

## 2018-05-20 NOTE — Telephone Encounter (Signed)
FYI.Marland KitchenMarland KitchenMarland KitchenSince Hollie Beach is no longer here. Per management refills for control medications pt will need to have virtual appt. Made appt for this afternoon @ 1:00 w/dr. Jenny Reichmann.Marland KitchenJohny Chess

## 2018-05-20 NOTE — Assessment & Plan Note (Signed)
Ok for pain med refill,  to f/u any worsening symptoms or concerns 

## 2018-05-20 NOTE — Patient Instructions (Signed)
Please continue all other medications as before, and refills have been done if requested.  Please have the pharmacy call with any other refills you may need.  Please keep your appointments with your specialists as you may have planned    

## 2018-05-20 NOTE — Telephone Encounter (Signed)
[place] Controlled Database Checked Last filled: 04/21/18 # 90 LOV w/PCP: 03/24/18 Next appt w/Charlotte Nche, NP: 06/23/18

## 2018-05-24 ENCOUNTER — Other Ambulatory Visit: Payer: Medicare Other

## 2018-06-21 ENCOUNTER — Ambulatory Visit: Payer: Medicare Other | Admitting: Nurse Practitioner

## 2018-06-21 ENCOUNTER — Other Ambulatory Visit: Payer: Self-pay

## 2018-06-21 ENCOUNTER — Ambulatory Visit
Admission: RE | Admit: 2018-06-21 | Discharge: 2018-06-21 | Disposition: A | Discharge status: Home / Self Care | Payer: Medicare Other | Source: Ambulatory Visit | Attending: Family Medicine | Admitting: Family Medicine

## 2018-06-21 DIAGNOSIS — M5416 Radiculopathy, lumbar region: Secondary | ICD-10-CM | POA: Diagnosis not present

## 2018-06-21 DIAGNOSIS — M47817 Spondylosis without myelopathy or radiculopathy, lumbosacral region: Secondary | ICD-10-CM | POA: Diagnosis not present

## 2018-06-21 MED ORDER — IOPAMIDOL (ISOVUE-M 300) INJECTION 61%: 1.0000 mL | Freq: Once | INTRAMUSCULAR | Status: DC | PRN

## 2018-06-21 MED ORDER — METHYLPREDNISOLONE ACETATE 40 MG/ML INJ SUSP (RADIOLOG: 120.0000 mg | Freq: Once | INTRAMUSCULAR | Status: DC

## 2018-06-21 NOTE — Discharge Instructions (Signed)

## 2018-06-23 ENCOUNTER — Ambulatory Visit (INDEPENDENT_AMBULATORY_CARE_PROVIDER_SITE_OTHER): Payer: Medicare Other | Admitting: Family

## 2018-06-23 ENCOUNTER — Telehealth: Payer: Self-pay | Admitting: Nurse Practitioner

## 2018-06-23 ENCOUNTER — Ambulatory Visit: Payer: Medicare Other | Admitting: Nurse Practitioner

## 2018-06-23 ENCOUNTER — Encounter: Payer: Self-pay | Admitting: Family

## 2018-06-23 DIAGNOSIS — Z87898 Personal history of other specified conditions: Secondary | ICD-10-CM

## 2018-06-23 DIAGNOSIS — Z87448 Personal history of other diseases of urinary system: Secondary | ICD-10-CM

## 2018-06-23 DIAGNOSIS — I10 Essential (primary) hypertension: Secondary | ICD-10-CM | POA: Diagnosis not present

## 2018-06-23 DIAGNOSIS — Z125 Encounter for screening for malignant neoplasm of prostate: Secondary | ICD-10-CM

## 2018-06-23 DIAGNOSIS — G8929 Other chronic pain: Secondary | ICD-10-CM

## 2018-06-23 DIAGNOSIS — Z13220 Encounter for screening for lipoid disorders: Secondary | ICD-10-CM | POA: Diagnosis not present

## 2018-06-23 DIAGNOSIS — F1120 Opioid dependence, uncomplicated: Secondary | ICD-10-CM | POA: Diagnosis not present

## 2018-06-23 DIAGNOSIS — R350 Frequency of micturition: Secondary | ICD-10-CM

## 2018-06-23 MED ORDER — HYDROCODONE-ACETAMINOPHEN 7.5-325 MG PO TABS: 1.0000 | ORAL_TABLET | Freq: Three times a day (TID) | ORAL | 0 refills | Status: AC
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
## 2018-06-23 NOTE — Telephone Encounter (Signed)
Refill given during 06/23/18 virtual visit today. See meds. Closing phone note.

## 2018-06-23 NOTE — Telephone Encounter (Signed)
Copied from [place] [id number]. Topic: Quick Communication - Rx Refill/Question >> Jun 23, 2018 10:16 AM Erick Blinks wrote: Medication: HYDROcodone-acetaminophen  Has the patient contacted their pharmacy? Yes  (Agent: If no, request that the patient contact the pharmacy for the refill.) (Agent: If yes, when and what did the pharmacy advise?)  Preferred Pharmacy (with phone number or street name): [id number] - [place], [address] [place] [address]    Agent: Please be advised that RX refills may take up to 3 business days. We ask that you follow-up with your pharmacy.

## 2018-07-14 ENCOUNTER — Other Ambulatory Visit: Payer: Self-pay | Admitting: *Deleted

## 2018-07-14 MED ORDER — HYDROCHLOROTHIAZIDE 25 MG PO TABS: 25.0000 mg | ORAL_TABLET | Freq: Every day | ORAL | 0 refills | Status: DC

## 2018-07-14 MED ORDER — LOSARTAN POTASSIUM 100 MG PO TABS: 100.0000 mg | ORAL_TABLET | Freq: Every day | ORAL | 0 refills | Status: DC

## 2018-07-19 ENCOUNTER — Other Ambulatory Visit (INDEPENDENT_AMBULATORY_CARE_PROVIDER_SITE_OTHER): Payer: Medicare Other

## 2018-07-19 DIAGNOSIS — F1120 Opioid dependence, uncomplicated: Secondary | ICD-10-CM

## 2018-07-19 DIAGNOSIS — I10 Essential (primary) hypertension: Secondary | ICD-10-CM | POA: Diagnosis not present

## 2018-07-19 DIAGNOSIS — Z125 Encounter for screening for malignant neoplasm of prostate: Secondary | ICD-10-CM | POA: Diagnosis not present

## 2018-07-19 DIAGNOSIS — Z13220 Encounter for screening for lipoid disorders: Secondary | ICD-10-CM

## 2018-07-19 DIAGNOSIS — Z87898 Personal history of other specified conditions: Secondary | ICD-10-CM

## 2018-07-19 DIAGNOSIS — R350 Frequency of micturition: Secondary | ICD-10-CM

## 2018-07-19 LAB — COMPREHENSIVE METABOLIC PANEL
ALT: 20 U/L (ref 0–53)
AST: 17 U/L (ref 0–37)
Albumin: 4.2 g/dL (ref 3.5–5.2)
Alkaline Phosphatase: 37 U/L — ABNORMAL LOW (ref 39–117)
BUN: 21 mg/dL (ref 6–23)
CO2: 31 mEq/L (ref 19–32)
Calcium: 9.7 mg/dL (ref 8.4–10.5)
Chloride: 100 mEq/L (ref 96–112)
Creatinine, Ser: 1.32 mg/dL (ref 0.40–1.50)
GFR: 67.25 mL/min (ref >60.00)
Glucose, Bld: 79 mg/dL (ref 70–99)
Potassium: 4.3 mEq/L (ref 3.5–5.1)
Sodium: 137 mEq/L (ref 135–145)
Total Bilirubin: 0.6 mg/dL (ref 0.2–1.2)
Total Protein: 6.7 g/dL (ref 6.0–8.3)

## 2018-07-19 LAB — LIPID PANEL
Cholesterol: 185 mg/dL (ref 0–200)
HDL: 72.2 mg/dL (ref >39.00)
LDL Cholesterol: 96 mg/dL (ref 0–99)
NonHDL: 112.79
Total CHOL/HDL Ratio: 3
Triglycerides: 83 mg/dL (ref 0.0–149.0)
VLDL: 16.6 mg/dL (ref 0.0–40.0)

## 2018-07-19 LAB — CBC WITH DIFFERENTIAL/PLATELET
Basophils Absolute: 0.1 10*3/uL (ref 0.0–0.1)
Basophils Relative: 0.6 % (ref 0.0–3.0)
Eosinophils Absolute: 0.1 10*3/uL (ref 0.0–0.7)
Eosinophils Relative: 1.2 % (ref 0.0–5.0)
HCT: 46 % (ref 39.0–52.0)
Hemoglobin: 15.3 g/dL (ref 13.0–17.0)
Lymphocytes Relative: 23.9 % (ref 12.0–46.0)
Lymphs Abs: 2 10*3/uL (ref 0.7–4.0)
MCHC: 33.2 g/dL (ref 30.0–36.0)
MCV: 90.1 fl (ref 78.0–100.0)
Monocytes Absolute: 0.8 10*3/uL (ref 0.1–1.0)
Monocytes Relative: 9.9 % (ref 3.0–12.0)
Neutro Abs: 5.3 10*3/uL (ref 1.4–7.7)
Neutrophils Relative %: 64.4 % (ref 43.0–77.0)
Platelets: 318 10*3/uL (ref 150.0–400.0)
RBC: 5.11 Mil/uL (ref 4.22–5.81)
RDW: 14.4 % (ref 11.5–15.5)
WBC: 8.3 10*3/uL (ref 4.0–10.5)

## 2018-07-19 LAB — PSA: PSA: 1.75 ng/mL (ref 0.10–4.00)

## 2018-07-22 LAB — PAIN MGMT, PROFILE 8 W/CONF, U
6 Acetylmorphine: NEGATIVE ng/mL
Alcohol Metabolites: NEGATIVE ng/mL (ref <500)
Amphetamines: NEGATIVE ng/mL
Benzodiazepines: NEGATIVE ng/mL
Buprenorphine, Urine: NEGATIVE ng/mL
Cocaine Metabolite: NEGATIVE ng/mL
Codeine: NEGATIVE ng/mL
Creatinine: 251.1 mg/dL
Hydrocodone: 6327 ng/mL
Hydromorphone: NEGATIVE ng/mL
MDMA: NEGATIVE ng/mL
Marijuana Metabolite: NEGATIVE ng/mL
Morphine: NEGATIVE ng/mL
Norhydrocodone: NEGATIVE ng/mL
Opiates: POSITIVE ng/mL
Oxidant: NEGATIVE ug/mL
Oxycodone: NEGATIVE ng/mL
pH: 5.9 (ref 4.5–9.0)

## 2018-07-26 DIAGNOSIS — Z79899 Other long term (current) drug therapy: Secondary | ICD-10-CM | POA: Diagnosis not present

## 2018-07-26 DIAGNOSIS — M48062 Spinal stenosis, lumbar region with neurogenic claudication: Secondary | ICD-10-CM | POA: Diagnosis not present

## 2018-07-26 DIAGNOSIS — M5136 Other intervertebral disc degeneration, lumbar region: Secondary | ICD-10-CM | POA: Diagnosis not present

## 2018-07-26 DIAGNOSIS — Z79891 Long term (current) use of opiate analgesic: Secondary | ICD-10-CM | POA: Diagnosis not present

## 2018-07-26 DIAGNOSIS — M4600 Spinal enthesopathy, site unspecified: Secondary | ICD-10-CM | POA: Diagnosis not present

## 2018-07-26 DIAGNOSIS — G894 Chronic pain syndrome: Secondary | ICD-10-CM | POA: Diagnosis not present

## 2018-08-05 ENCOUNTER — Telehealth: Payer: Self-pay | Admitting: Emergency Medicine

## 2018-08-05 ENCOUNTER — Other Ambulatory Visit: Payer: Self-pay | Admitting: Internal Medicine

## 2018-08-05 NOTE — Telephone Encounter (Signed)
Pt is requesting a refill on his sildenafil (VIAGRA) 100 MG tablet. Pt stated the pharmacy is [facility], [phone] [place] [address]. Thanks.

## 2018-08-05 NOTE — Telephone Encounter (Signed)
Pt is requesting a refill on his sildenafil (VIAGRA) 100 MG tablet. Pt stated the pharmacy is [facility], [phone] [address] [address]. Thanks.

## 2018-08-11 ENCOUNTER — Ambulatory Visit (INDEPENDENT_AMBULATORY_CARE_PROVIDER_SITE_OTHER): Payer: Medicare Other | Admitting: *Deleted

## 2018-08-11 DIAGNOSIS — Z0000 Encounter for general adult medical examination without abnormal findings: Secondary | ICD-10-CM | POA: Diagnosis not present
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
## 2018-08-12 ENCOUNTER — Other Ambulatory Visit: Payer: Self-pay | Admitting: Internal Medicine

## 2018-08-12 ENCOUNTER — Telehealth: Payer: Self-pay | Admitting: Nurse Practitioner

## 2018-08-12 DIAGNOSIS — N529 Male erectile dysfunction, unspecified: Secondary | ICD-10-CM

## 2018-08-12 MED ORDER — SILDENAFIL CITRATE 100 MG PO TABS: 100.0000 mg | ORAL_TABLET | Freq: Every day | ORAL | 2 refills | Status: DC | PRN

## 2018-08-12 NOTE — Telephone Encounter (Signed)
Patient calling to check status of medication refill

## 2018-08-12 NOTE — Telephone Encounter (Signed)
Medication Refill - Medication: sildenafil (VIAGRA) 100 MG tablet  Has the patient contacted their pharmacy? NO (Agent: If no, request that the patient contact the pharmacy for the refill.) (Agent: If yes, when and what did the pharmacy advise?)  Preferred Pharmacy (with phone number or street name): [facility] -[phone], [place] [address]  Agent: Please be advised that RX refills may take up to 3 business days. We ask that you follow-up with your pharmacy.   Pt stated he had talked to someone in the office and this is a discount RX place and the prescription need to be sent to the above address.

## 2018-08-13 NOTE — Telephone Encounter (Signed)
Pt informed that rx has been sent.

## 2018-08-13 NOTE — Telephone Encounter (Signed)
This was sent in by Dr. Ronnald Ramp.

## 2018-08-14 DIAGNOSIS — Z1159 Encounter for screening for other viral diseases: Secondary | ICD-10-CM | POA: Diagnosis not present

## 2018-08-14 DIAGNOSIS — Z01812 Encounter for preprocedural laboratory examination: Secondary | ICD-10-CM | POA: Diagnosis not present

## 2018-08-17 DIAGNOSIS — M48062 Spinal stenosis, lumbar region with neurogenic claudication: Secondary | ICD-10-CM | POA: Diagnosis not present

## 2018-08-17 DIAGNOSIS — M5136 Other intervertebral disc degeneration, lumbar region: Secondary | ICD-10-CM | POA: Diagnosis not present

## 2018-08-17 DIAGNOSIS — Z006 Encounter for examination for normal comparison and control in clinical research program: Secondary | ICD-10-CM | POA: Diagnosis not present

## 2018-08-17 DIAGNOSIS — I10 Essential (primary) hypertension: Secondary | ICD-10-CM | POA: Diagnosis not present

## 2018-08-23 DIAGNOSIS — M4600 Spinal enthesopathy, site unspecified: Secondary | ICD-10-CM | POA: Diagnosis not present

## 2018-08-23 DIAGNOSIS — M79661 Pain in right lower leg: Secondary | ICD-10-CM | POA: Diagnosis not present

## 2018-08-23 DIAGNOSIS — G894 Chronic pain syndrome: Secondary | ICD-10-CM | POA: Diagnosis not present

## 2018-08-23 DIAGNOSIS — M5136 Other intervertebral disc degeneration, lumbar region: Secondary | ICD-10-CM | POA: Diagnosis not present

## 2018-08-23 DIAGNOSIS — M48062 Spinal stenosis, lumbar region with neurogenic claudication: Secondary | ICD-10-CM | POA: Diagnosis not present

## 2018-08-31 DIAGNOSIS — M5417 Radiculopathy, lumbosacral region: Secondary | ICD-10-CM | POA: Diagnosis not present

## 2018-09-20 DIAGNOSIS — M5136 Other intervertebral disc degeneration, lumbar region: Secondary | ICD-10-CM | POA: Diagnosis not present

## 2018-09-20 DIAGNOSIS — M4600 Spinal enthesopathy, site unspecified: Secondary | ICD-10-CM | POA: Diagnosis not present

## 2018-09-20 DIAGNOSIS — G894 Chronic pain syndrome: Secondary | ICD-10-CM | POA: Diagnosis not present

## 2018-09-20 DIAGNOSIS — M48062 Spinal stenosis, lumbar region with neurogenic claudication: Secondary | ICD-10-CM | POA: Diagnosis not present

## 2018-10-04 DIAGNOSIS — M48062 Spinal stenosis, lumbar region with neurogenic claudication: Secondary | ICD-10-CM | POA: Diagnosis not present

## 2018-10-04 DIAGNOSIS — M5136 Other intervertebral disc degeneration, lumbar region: Secondary | ICD-10-CM | POA: Diagnosis not present

## 2018-10-04 DIAGNOSIS — M4600 Spinal enthesopathy, site unspecified: Secondary | ICD-10-CM | POA: Diagnosis not present

## 2018-10-19 DIAGNOSIS — Z79891 Long term (current) use of opiate analgesic: Secondary | ICD-10-CM | POA: Diagnosis not present

## 2018-10-19 DIAGNOSIS — M4600 Spinal enthesopathy, site unspecified: Secondary | ICD-10-CM | POA: Diagnosis not present

## 2018-10-19 DIAGNOSIS — G894 Chronic pain syndrome: Secondary | ICD-10-CM | POA: Diagnosis not present

## 2018-10-19 DIAGNOSIS — Z79899 Other long term (current) drug therapy: Secondary | ICD-10-CM | POA: Diagnosis not present

## 2018-10-19 DIAGNOSIS — M48062 Spinal stenosis, lumbar region with neurogenic claudication: Secondary | ICD-10-CM | POA: Diagnosis not present

## 2018-10-19 DIAGNOSIS — M5136 Other intervertebral disc degeneration, lumbar region: Secondary | ICD-10-CM | POA: Diagnosis not present

## 2018-11-05 ENCOUNTER — Other Ambulatory Visit: Payer: Self-pay

## 2018-11-05 ENCOUNTER — Telehealth (INDEPENDENT_AMBULATORY_CARE_PROVIDER_SITE_OTHER): Payer: Medicare Other | Admitting: Family Medicine

## 2018-11-05 DIAGNOSIS — I10 Essential (primary) hypertension: Secondary | ICD-10-CM | POA: Diagnosis not present

## 2018-11-05 DIAGNOSIS — G8929 Other chronic pain: Secondary | ICD-10-CM

## 2018-11-05 DIAGNOSIS — N529 Male erectile dysfunction, unspecified: Secondary | ICD-10-CM

## 2018-11-05 MED ORDER — LOSARTAN POTASSIUM 100 MG PO TABS: 100.0000 mg | ORAL_TABLET | Freq: Every day | ORAL | 3 refills | Status: DC
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
## 2018-11-09 ENCOUNTER — Telehealth: Payer: Self-pay | Admitting: Family Medicine

## 2018-11-09 NOTE — Telephone Encounter (Signed)
Ok to call in refill?  

## 2018-11-09 NOTE — Telephone Encounter (Signed)
Pt states that Dr Urbano Heir request the following info after his appt yesterday. He was in [employer] for his . sildenafil (VIAGRA) 100 MG tablet [id number]  [employer]) chged to [facility], but virtually same. DR. needs to do e-script to [phone] phone,  by faxing to [phone] [facility] [address] refill in this manner.

## 2018-11-09 NOTE — Telephone Encounter (Signed)
Pt states that Dr Urbano Heir request the following info after his appt yesterday. He was in [employer] for his . sildenafil (VIAGRA) 100 MG tablet [id number]  [employer]) chged to [facility], but virtually same. DR. needs to do e-script to [phone] phone,  by faxing to [phone] Pls place refill in this manner

## 2018-11-10 NOTE — Telephone Encounter (Signed)
That's fine

## 2018-11-11 NOTE — Telephone Encounter (Signed)
Patient called in checking to see if medication has been sent on over to [facility]. Please advise.

## 2018-11-12 NOTE — Telephone Encounter (Signed)
Patient would like to confirm if paperwork was received / filled out  by PCP, informed patient please allow 3 to 7 business day turn around, please advise patient directly

## 2018-11-15 NOTE — Telephone Encounter (Signed)
Ok to send refill  

## 2018-11-15 NOTE — Telephone Encounter (Signed)
ok 

## 2018-11-15 NOTE — Telephone Encounter (Signed)
Pt called to follow up on rx for  sildenafil (VIAGRA) 100 MG tablet [id number] . Originally requested refill on 11/09/18 ([facility]) chged to [facility], but virtually same. DR. needs to do e-script to [id number] phone,  by faxing to [phone] Pls place refill in this manner.  Requesting CB once this has been sent in so he doesn't have to keep calling. Please advise.

## 2018-11-16 NOTE — Telephone Encounter (Signed)
Patient was checking on the status of request mentioned below and would like a call today at [phone] once Rx is sent

## 2018-11-17 ENCOUNTER — Other Ambulatory Visit: Payer: Self-pay

## 2018-11-17 DIAGNOSIS — N529 Male erectile dysfunction, unspecified: Secondary | ICD-10-CM

## 2018-11-17 MED ORDER — SILDENAFIL CITRATE 100 MG PO TABS: 100.0000 mg | ORAL_TABLET | Freq: Every day | ORAL | 2 refills | Status: DC | PRN

## 2018-11-17 NOTE — Telephone Encounter (Signed)
Patient calling again to check status. He is requesting a call back.

## 2018-11-17 NOTE — Telephone Encounter (Signed)
Rx has already been faxed to pt pharmacy, pt is aware

## 2018-11-18 DIAGNOSIS — M5136 Other intervertebral disc degeneration, lumbar region: Secondary | ICD-10-CM | POA: Diagnosis not present

## 2018-11-18 DIAGNOSIS — M4600 Spinal enthesopathy, site unspecified: Secondary | ICD-10-CM | POA: Diagnosis not present

## 2018-11-18 DIAGNOSIS — M48062 Spinal stenosis, lumbar region with neurogenic claudication: Secondary | ICD-10-CM | POA: Diagnosis not present

## 2018-11-18 DIAGNOSIS — G894 Chronic pain syndrome: Secondary | ICD-10-CM | POA: Diagnosis not present

## 2018-12-16 DIAGNOSIS — M48062 Spinal stenosis, lumbar region with neurogenic claudication: Secondary | ICD-10-CM | POA: Diagnosis not present

## 2018-12-16 DIAGNOSIS — M5136 Other intervertebral disc degeneration, lumbar region: Secondary | ICD-10-CM | POA: Diagnosis not present

## 2018-12-16 DIAGNOSIS — G894 Chronic pain syndrome: Secondary | ICD-10-CM | POA: Diagnosis not present

## 2018-12-16 DIAGNOSIS — M79604 Pain in right leg: Secondary | ICD-10-CM | POA: Diagnosis not present

## 2019-01-18 DIAGNOSIS — M5136 Other intervertebral disc degeneration, lumbar region: Secondary | ICD-10-CM | POA: Diagnosis not present

## 2019-01-18 DIAGNOSIS — M545 Low back pain: Secondary | ICD-10-CM | POA: Diagnosis not present

## 2019-01-18 DIAGNOSIS — G894 Chronic pain syndrome: Secondary | ICD-10-CM | POA: Diagnosis not present

## 2019-01-18 DIAGNOSIS — M25559 Pain in unspecified hip: Secondary | ICD-10-CM | POA: Diagnosis not present

## 2019-01-27 ENCOUNTER — Telehealth: Payer: Self-pay | Admitting: Family Medicine

## 2019-01-27 DIAGNOSIS — N529 Male erectile dysfunction, unspecified: Secondary | ICD-10-CM

## 2019-01-27 NOTE — Telephone Encounter (Signed)
losartan (COZAAR) 100 MG tablet [    Patient is requesting refill.   Pharmacy:  [id number] [address], [address] Phone:  [phone]  Fax:  [phone]        Patient also requesting sildenafil (VIAGRA) 100 MG tablet   30 tablets sent electronically to [employer] program.

## 2019-01-27 NOTE — Telephone Encounter (Signed)
Message Routed to PCP CMA 

## 2019-01-31 NOTE — Telephone Encounter (Signed)
Rx was sent to pt pharmacy

## 2019-02-01 ENCOUNTER — Other Ambulatory Visit: Payer: Self-pay

## 2019-02-01 ENCOUNTER — Telehealth: Payer: Self-pay | Admitting: Family Medicine

## 2019-02-01 DIAGNOSIS — N529 Male erectile dysfunction, unspecified: Secondary | ICD-10-CM

## 2019-02-01 MED ORDER — SILDENAFIL CITRATE 100 MG PO TABS: 100.0000 mg | ORAL_TABLET | Freq: Every day | ORAL | 2 refills | Status: DC | PRN

## 2019-02-01 NOTE — Telephone Encounter (Signed)
Patient is aware that script is at the pharmacy

## 2019-02-01 NOTE — Telephone Encounter (Signed)
Medication Refill - Medication: sildenafil (VIAGRA) 100 MG tablet  Has the patient contacted their pharmacy? Yes.   (Agent: If no, request that the patient contact the pharmacy for the refill.) (Agent: If yes, when and what did the pharmacy advise?)  Preferred Pharmacy (with phone number or street name): [facility] [place], [place] - [address]  Agent: Please be advised that RX refills may take up to 3 business days. We ask that you follow-up with your pharmacy.

## 2019-02-01 NOTE — Telephone Encounter (Signed)
Rx was sent to pt pharmacy

## 2019-02-01 NOTE — Telephone Encounter (Signed)
Requested medication (s) are due for refill today: yes  Requested medication (s) are on the active medication list: yes  Last refill:  02/01/2019  Future visit scheduled: no  Notes to clinic:  Patient would like a year supply sent to pharmacy. Please advise    Requested Prescriptions  Pending Prescriptions Disp Refills   sildenafil (VIAGRA) 100 MG tablet 30 tablet 2    Sig: Take 1 tablet (100 mg total) by mouth daily as needed for erectile dysfunction.      Urology: Erectile Dysfunction Agents Failed - 02/01/2019 11:52 AM      Failed - Last BP in normal range    BP Readings from Last 1 Encounters:  06/21/18 (!) 152/82          Passed - Valid encounter within last 12 months    Recent Outpatient Visits           2 months ago Essential hypertension   Therapist[facility], [place]   7 months ago Essential hypertension   Clemmons, Marvis Repress, [place]   8 months ago Encounter for pain [facility] -Georges Mouse, MD   9 months ago Lumbar radiculopathy   Bushnell, Olevia Bowens, DO   10 months ago Lumbar radiculitis   Murray, Delphia Grates, NP

## 2019-02-01 NOTE — Telephone Encounter (Signed)
Message routed to PCP CMA   Pt last appt. 11/2018 for HTN etc.

## 2019-02-01 NOTE — Telephone Encounter (Signed)
Medication Refill - Medication: losartan (COZAAR) 100 MG tablet    Has the patient contacted their pharmacy? Yes - states they have no RX or refills for him.  States he has been told by multiple people this has been done for him, but he can't get at the pharmacy.  Pt would like to speak with someone. (Agent: If no, request that the patient contact the pharmacy for the refill.) (Agent: If yes, when and what did the pharmacy advise?)  Preferred Pharmacy (with phone number or street name):  [id number] [address], [address] Phone:  [phone]  Fax:  [phone]     Agent: Please be advised that RX refills may take up to 3 business days. We ask that you follow-up with your pharmacy.

## 2019-02-07 ENCOUNTER — Telehealth: Payer: Self-pay | Admitting: Family Medicine

## 2019-02-07 NOTE — Telephone Encounter (Signed)
Copied from [place] [id number]. Topic: General - Inquiry >> Feb 07, 2019  4:04 PM Percell Belt A wrote: Reason for CRM: Pt called to follow up on rx for sildenafil (VIAGRA) 100 MG tablet [id number]. Originally requested refill on 11/09/18 ([facility]) chged to [facility], but virtually same. DR. needs to do e-script to [id number] phone, by faxing to [phone]  Pt best call back number [phone]

## 2019-02-09 ENCOUNTER — Other Ambulatory Visit: Payer: Self-pay

## 2019-02-09 DIAGNOSIS — N529 Male erectile dysfunction, unspecified: Secondary | ICD-10-CM

## 2019-02-09 MED ORDER — SILDENAFIL CITRATE 100 MG PO TABS: 100.0000 mg | ORAL_TABLET | Freq: Every day | ORAL | 2 refills | Status: DC | PRN

## 2019-02-09 NOTE — Telephone Encounter (Signed)
Spoke with pt aware that Rx for Sildenafil was resent to [facility]

## 2019-02-09 NOTE — Telephone Encounter (Signed)
Pt Rx for Sildenafil was resent to the correct pharmacy. Pt losartan is not due for refill yet

## 2019-02-09 NOTE — Telephone Encounter (Signed)
Patient called and would like to speak with someone about his medication sildenafil (VIAGRA) 100 MG tablet. The medication was send to the wrong pharmacy again and he has been out of the medication for 3 weeks. He stated that he is a married man and that he needs this medication. Medication needs to be send to [address]. Please call patient when this is done please.

## 2019-03-15 DIAGNOSIS — M79604 Pain in right leg: Secondary | ICD-10-CM | POA: Diagnosis not present

## 2019-03-15 DIAGNOSIS — M4600 Spinal enthesopathy, site unspecified: Secondary | ICD-10-CM | POA: Diagnosis not present

## 2019-03-15 DIAGNOSIS — M48062 Spinal stenosis, lumbar region with neurogenic claudication: Secondary | ICD-10-CM | POA: Diagnosis not present

## 2019-03-15 DIAGNOSIS — M5136 Other intervertebral disc degeneration, lumbar region: Secondary | ICD-10-CM | POA: Diagnosis not present

## 2019-04-15 ENCOUNTER — Telehealth: Payer: Self-pay | Admitting: Family Medicine

## 2019-04-15 NOTE — Telephone Encounter (Signed)
Medication refill: sildenafil 90 day supply [facility]acy: [facility] FAX: [phone]

## 2019-04-18 ENCOUNTER — Other Ambulatory Visit: Payer: Self-pay

## 2019-04-18 DIAGNOSIS — N529 Male erectile dysfunction, unspecified: Secondary | ICD-10-CM

## 2019-04-18 MED ORDER — SILDENAFIL CITRATE 100 MG PO TABS: 100.0000 mg | ORAL_TABLET | Freq: Every day | ORAL | 2 refills | Status: DC | PRN

## 2019-04-18 NOTE — Telephone Encounter (Signed)
Rx faxed to pt pharmacy 

## 2019-04-18 NOTE — Telephone Encounter (Signed)
Ok to refill sildenafil 100 mg 30 tabs with 2 refills.

## 2019-04-18 NOTE — Telephone Encounter (Signed)
Pt request for a 90 days refill for Sildenafil. LOV was 11/05/2019 and last refill was on 02/09/2019 for 30 tablets with 2 refill, please advise

## 2019-04-21 ENCOUNTER — Other Ambulatory Visit: Payer: Self-pay

## 2019-04-21 ENCOUNTER — Telehealth: Payer: Self-pay | Admitting: Family Medicine

## 2019-04-21 DIAGNOSIS — I10 Essential (primary) hypertension: Secondary | ICD-10-CM

## 2019-04-21 MED ORDER — LOSARTAN POTASSIUM 100 MG PO TABS: 100.0000 mg | ORAL_TABLET | Freq: Every day | ORAL | 3 refills | Status: AC

## 2019-04-21 NOTE — Telephone Encounter (Signed)
Error

## 2019-04-21 NOTE — Telephone Encounter (Signed)
Medication Refill: Losartan  Pharmacy: [facility]  Phone:

## 2019-04-21 NOTE — Telephone Encounter (Signed)
error 

## 2019-04-21 NOTE — Telephone Encounter (Signed)
Medication Refill:  Pharmacy:  Phone:

## 2019-04-21 NOTE — Telephone Encounter (Signed)
Rx sent 
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
## 2019-05-13 DIAGNOSIS — M79604 Pain in right leg: Secondary | ICD-10-CM | POA: Diagnosis not present

## 2019-05-13 DIAGNOSIS — G894 Chronic pain syndrome: Secondary | ICD-10-CM | POA: Diagnosis not present

## 2019-05-13 DIAGNOSIS — M5136 Other intervertebral disc degeneration, lumbar region: Secondary | ICD-10-CM | POA: Diagnosis not present

## 2019-05-13 DIAGNOSIS — Z79891 Long term (current) use of opiate analgesic: Secondary | ICD-10-CM | POA: Diagnosis not present

## 2019-05-13 DIAGNOSIS — Z79899 Other long term (current) drug therapy: Secondary | ICD-10-CM | POA: Diagnosis not present

## 2019-05-13 DIAGNOSIS — M48062 Spinal stenosis, lumbar region with neurogenic claudication: Secondary | ICD-10-CM | POA: Diagnosis not present

## 2019-05-25 DIAGNOSIS — H40033 Anatomical narrow angle, bilateral: Secondary | ICD-10-CM | POA: Diagnosis not present

## 2019-05-25 DIAGNOSIS — H16223 Keratoconjunctivitis sicca, not specified as Sjogren's, bilateral: Secondary | ICD-10-CM | POA: Diagnosis not present

## 2019-06-09 NOTE — Telephone Encounter (Signed)
Opened in error

## 2019-06-23 ENCOUNTER — Telehealth: Payer: Self-pay | Admitting: Family Medicine

## 2019-06-23 NOTE — Telephone Encounter (Signed)
Pt is requesting a refill on Viagra 100 mg tablet. Pt would also, like a 90 day supply. Pt uses the mail order service and says, Dr. Volanda Napoleon, is aware because she filled the medication 3 months ago. Thanks

## 2019-06-23 NOTE — Telephone Encounter (Signed)
Pt scheduled for an office visit on 06/27/2019 at 3 pm

## 2019-06-27 ENCOUNTER — Telehealth (INDEPENDENT_AMBULATORY_CARE_PROVIDER_SITE_OTHER): Payer: Medicare Other | Admitting: Family Medicine

## 2019-06-27 DIAGNOSIS — N529 Male erectile dysfunction, unspecified: Secondary | ICD-10-CM | POA: Diagnosis not present

## 2019-06-27 DIAGNOSIS — I10 Essential (primary) hypertension: Secondary | ICD-10-CM
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
## 2019-07-01 ENCOUNTER — Telehealth: Payer: Self-pay | Admitting: Family Medicine

## 2019-07-01 NOTE — Telephone Encounter (Signed)
Pt is calling in stating that he was wanting to see if the sildenafil 100 MG can be sent in for 90 days to [facility].

## 2019-07-01 NOTE — Telephone Encounter (Signed)
Pt has a virtual appointment for medication refill (Sildenafil) ok to send Rx to pt pharmacy

## 2019-07-02 ENCOUNTER — Encounter: Payer: Self-pay | Admitting: Family Medicine

## 2019-07-02 MED ORDER — SILDENAFIL CITRATE 100 MG PO TABS: 100.0000 mg | ORAL_TABLET | Freq: Every day | ORAL | 2 refills | Status: DC | PRN

## 2019-07-02 NOTE — Telephone Encounter (Signed)
No b/c pt needs to be seen in person.  Pt is aware.  Has upcoming appt.

## 2019-07-05 NOTE — Telephone Encounter (Signed)
Spoke with pt aware that Dr Volanda Napoleon will review his Sildenafil refill at time of his visit

## 2019-07-07 ENCOUNTER — Encounter: Payer: Self-pay | Admitting: Family Medicine

## 2019-07-07 ENCOUNTER — Other Ambulatory Visit: Payer: Self-pay

## 2019-07-07 ENCOUNTER — Ambulatory Visit (INDEPENDENT_AMBULATORY_CARE_PROVIDER_SITE_OTHER): Payer: Medicare Other | Admitting: Family Medicine

## 2019-07-07 VITALS — BP 151/91 | HR 56 | Temp 97.8°F | Wt 231.0 lb

## 2019-07-07 DIAGNOSIS — Z0000 Encounter for general adult medical examination without abnormal findings: Secondary | ICD-10-CM

## 2019-07-07 DIAGNOSIS — Z113 Encounter for screening for infections with a predominantly sexual mode of transmission: Secondary | ICD-10-CM | POA: Diagnosis not present

## 2019-07-07 DIAGNOSIS — Z1159 Encounter for screening for other viral diseases: Secondary | ICD-10-CM

## 2019-07-07 DIAGNOSIS — I10 Essential (primary) hypertension: Secondary | ICD-10-CM

## 2019-07-07 DIAGNOSIS — N529 Male erectile dysfunction, unspecified: Secondary | ICD-10-CM | POA: Diagnosis not present

## 2019-07-07 DIAGNOSIS — Z125 Encounter for screening for malignant neoplasm of prostate: Secondary | ICD-10-CM

## 2019-07-07 DIAGNOSIS — G8929 Other chronic pain: Secondary | ICD-10-CM | POA: Diagnosis not present

## 2019-07-07 DIAGNOSIS — Z13220 Encounter for screening for lipoid disorders: Secondary | ICD-10-CM

## 2019-07-07 DIAGNOSIS — Z131 Encounter for screening for diabetes mellitus: Secondary | ICD-10-CM

## 2019-07-07 LAB — CBC WITH DIFFERENTIAL/PLATELET
Basophils Absolute: 0 10*3/uL (ref 0.0–0.1)
Basophils Relative: 0.5 % (ref 0.0–3.0)
Eosinophils Absolute: 0.2 10*3/uL (ref 0.0–0.7)
Eosinophils Relative: 3.1 % (ref 0.0–5.0)
HCT: 41.9 % (ref 39.0–52.0)
Hemoglobin: 13.8 g/dL (ref 13.0–17.0)
Lymphocytes Relative: 21.8 % (ref 12.0–46.0)
Lymphs Abs: 1.6 10*3/uL (ref 0.7–4.0)
MCHC: 32.9 g/dL (ref 30.0–36.0)
MCV: 88.6 fl (ref 78.0–100.0)
Monocytes Absolute: 0.9 10*3/uL (ref 0.1–1.0)
Monocytes Relative: 11.9 % (ref 3.0–12.0)
Neutro Abs: 4.6 10*3/uL (ref 1.4–7.7)
Neutrophils Relative %: 62.7 % (ref 43.0–77.0)
Platelets: 268 10*3/uL (ref 150.0–400.0)
RBC: 4.73 Mil/uL (ref 4.22–5.81)
RDW: 14.7 % (ref 11.5–15.5)
WBC: 7.3 10*3/uL (ref 4.0–10.5)

## 2019-07-07 LAB — COMPREHENSIVE METABOLIC PANEL
ALT: 14 U/L (ref 0–53)
AST: 13 U/L (ref 0–37)
Albumin: 4.2 g/dL (ref 3.5–5.2)
Alkaline Phosphatase: 39 U/L (ref 39–117)
BUN: 15 mg/dL (ref 6–23)
CO2: 31 mEq/L (ref 19–32)
Calcium: 9.5 mg/dL (ref 8.4–10.5)
Chloride: 102 mEq/L (ref 96–112)
Creatinine, Ser: 1.21 mg/dL (ref 0.40–1.50)
GFR: 74.11 mL/min (ref >60.00)
Glucose, Bld: 86 mg/dL (ref 70–99)
Potassium: 4.5 mEq/L (ref 3.5–5.1)
Sodium: 139 mEq/L (ref 135–145)
Total Bilirubin: 0.5 mg/dL (ref 0.2–1.2)
Total Protein: 6.9 g/dL (ref 6.0–8.3)

## 2019-07-07 LAB — LIPID PANEL
Cholesterol: 194 mg/dL (ref 0–200)
HDL: 80.5 mg/dL (ref >39.00)
LDL Cholesterol: 103 mg/dL — ABNORMAL HIGH (ref 0–99)
NonHDL: 113.79
Total CHOL/HDL Ratio: 2
Triglycerides: 52 mg/dL (ref 0.0–149.0)
VLDL: 10.4 mg/dL (ref 0.0–40.0)

## 2019-07-07 LAB — TSH: TSH: 2.19 u[IU]/mL (ref 0.35–4.50)

## 2019-07-07 LAB — T4, FREE: Free T4: 0.83 ng/dL (ref 0.60–1.60)

## 2019-07-07 LAB — PSA, MEDICARE: PSA: 1.21 ng/ml (ref 0.10–4.00)

## 2019-07-07 LAB — HEMOGLOBIN A1C: Hgb A1c MFr Bld: 5.9 % (ref 4.6–6.5)

## 2019-07-07 MED ORDER — AMLODIPINE BESYLATE 2.5 MG PO TABS: 2.5000 mg | ORAL_TABLET | Freq: Every day | ORAL | 3 refills | Status: DC

## 2019-07-07 NOTE — Progress Notes (Signed)
Subjective:     Alex Hogan is a 59 y.o. male and is here for a comprehensive physical exam. Pt taking losartan 100 mg for HTN.  Pt states his bp has never been under 140s sys.  Pt states "water pills" did not help with his bp in the past.  States he dose not want additional meds.  Pt states "if something happens because of my blood pressure, then it happens".  Receiving sildenafil via an assistance program.  Endorses chronic back pain, seen by pain management.  Colonoscopy done 09/24/2015.  Hep C testing done.  Pt denies issues with hearing, vision, memory, or recent falls.  Social History   Socioeconomic History   Marital status: Widowed    Spouse name: Not on file   Number of children: 2   Years of education: 12   Highest education level: Not on file  Occupational History   Occupation: Disability    Comment: Back   Tobacco Use   Smoking status: Former Smoker   Smokeless tobacco: Never Used  Substance and Sexual Activity   Alcohol use: Yes    Comment: occasional beer   Drug use: No   Sexual activity: Yes  Other Topics Concern   Not on file  Social History Narrative   Not on file   Social Determinants of Health   Financial Resource Strain: Low Risk    Difficulty of Paying Living Expenses: Not hard at all  Food Insecurity: No Food Insecurity   Worried About Charity fundraiser in the Last Year: Never true   [place] in the Last Year: Never true  Transportation Needs: No Transportation Needs   Lack of Transportation (Medical): No   Lack of Transportation (Non-Medical): No  Physical Activity: Inactive   Days of Exercise per Week: 0 days   Minutes of Exercise per Session: 0 min  Stress: No Stress Concern Present   Feeling of Stress : Only a little  Social Connections: Somewhat Isolated   Frequency of Communication with Friends and Family: More than three times a week   Frequency of Social Gatherings with Friends and Family: More than  three times a week   Attends Religious Services: 1 to 4 times per year   Active Member of [employer] or Organizations: No   Attends Archivist Meetings: Never   Marital Status: Widowed  Human resources officer Violence:    Fear of Current or Ex-Partner:    Emotionally Abused:    Physically Abused:    Sexually Abused:    Health Maintenance  Topic Date Due   Hepatitis C Screening  Never done   COVID-19 Vaccine (1) Never done   HIV Screening  Never done   INFLUENZA VACCINE  09/04/2019   TETANUS/TDAP  09/19/2025   COLONOSCOPY  09/23/2025    The following portions of the patient's history were reviewed and updated as appropriate: allergies, current medications, past family history, past medical history, past social history, past surgical history and problem list.  Review of Systems Pertinent items noted in HPI and remainder of comprehensive ROS otherwise negative.   Objective:    BP (!) 151/91 (BP Location: Left Arm, Patient Position: Sitting, Cuff Size: Large)    Pulse (!) 56    Temp 97.8 F (36.6 C) (Temporal)    Wt 231 lb (104.8 kg)    SpO2 98%    BMI 28.87 kg/m  General appearance: alert, cooperative and no distress Head: Normocephalic, without obvious abnormality, atraumatic Eyes: conjunctivae/corneas  clear. PERRL, EOM's intact. Fundi benign. Ears: normal TM's and external ear canals both ears Nose: Nares normal. Septum midline. Mucosa normal. No drainage or sinus tenderness. Throat: lips, mucosa, and tongue normal; teeth and gums normal Neck: no adenopathy, no carotid bruit, no JVD, supple, symmetrical, trachea midline and thyroid not enlarged, symmetric, no tenderness/mass/nodules Lungs: clear to auscultation bilaterally Heart: regular rate and rhythm, S1, S2 normal, no murmur, click, rub or gallop Abdomen: soft, non-tender; bowel sounds normal; no masses,  no organomegaly Extremities: extremities normal, atraumatic, no cyanosis or edema Pulses: 2+ and  symmetric Skin: Skin color, texture, turgor normal. No rashes or lesions Lymph nodes: Cervical, supraclavicular, and axillary nodes normal. Neurologic: Alert and oriented X 3, normal strength and tone. Normal symmetric reflexes. Normal coordination and gait    Assessment:    Healthy male exam.      Plan:     Anticipatory guidance given including wearing seatbelts, smoke detectors in the home, increasing physical activity, increasing p.o. intake of water and vegetables. -We will obtain labs -colonoscopy done 09/24/15 due in 2027 -given handout -next CPE in 1 yr See After Visit Summary for Counseling Recommendations    Essential hypertension  -Elevated -Likely multifactorial including family history, chronic pain Admitting complications -Continue losartan 100 mg daily -We will add Norvasc 2.5 mg daily -pt to obtain bp cuff and monitor at home.  Keep a log to bring to clinic. - Plan: Comprehensive metabolic panel, amLODipine (NORVASC) 2.5 MG tablet  Screening for cholesterol level  - Plan: Lipid panel  Screening for prostate cancer  - Plan: PSA, Medicare  Other chronic pain -Continue Norco 7.5-325 mg -Continue follow-up with pain management - Plan: CBC with Differential/Platelet  Screening for diabetes mellitus - Plan: Hemoglobin A1c  Need for hepatitis C screening test - Plan: Hep C Antibody  Erectile dysfunction, unspecified erectile dysfunction type -Discussed blood pressure can contribute to ED -Continue sildenafil 100 mg.  Pt obtains via assistance program.  Routine screening for STI (sexually transmitted infection)  - Plan: HIV antibody (with reflex), RPR, C. trachomatis/N. gonorrhoeae RNA  F/u in 1 month for HTN  Grier Mitts, MD

## 2019-07-07 NOTE — Patient Instructions (Addendum)
Preventive Care 41-60 Years Old, Male Preventive care refers to lifestyle choices and visits with your health care provider that can promote health and wellness. This includes:  A yearly physical exam. This is also called an annual well check.  Regular dental and eye exams.  Immunizations.  Screening for certain conditions.  Healthy lifestyle choices, such as eating a healthy diet, getting regular exercise, not using drugs or products that contain nicotine and tobacco, and limiting alcohol use. What can I expect for my preventive care visit? Physical exam Your health care provider will check:  Height and weight. These may be used to calculate body mass index (BMI), which is a measurement that tells if you are at a healthy weight.  Heart rate and blood pressure.  Your skin for abnormal spots. Counseling Your health care provider may ask you questions about:  Alcohol, tobacco, and drug use.  Emotional well-being.  Home and relationship well-being.  Sexual activity.  Eating habits.  Work and work Statistician. What immunizations do I need?  Influenza (flu) vaccine  This is recommended every year. Tetanus, diphtheria, and pertussis (Tdap) vaccine  You may need a Td booster every 10 years. Varicella (chickenpox) vaccine  You may need this vaccine if you have not already been vaccinated. Zoster (shingles) vaccine  You may need this after age 64. Measles, mumps, and rubella (MMR) vaccine  You may need at least one dose of MMR if you were born in 1957 or later. You may also need a second dose. Pneumococcal conjugate (PCV13) vaccine  You may need this if you have certain conditions and were not previously vaccinated. Pneumococcal polysaccharide (PPSV23) vaccine  You may need one or two doses if you smoke cigarettes or if you have certain conditions. Meningococcal conjugate (MenACWY) vaccine  You may need this if you have certain conditions. Hepatitis A  vaccine  You may need this if you have certain conditions or if you travel or work in places where you may be exposed to hepatitis A. Hepatitis B vaccine  You may need this if you have certain conditions or if you travel or work in places where you may be exposed to hepatitis B. Haemophilus influenzae type b (Hib) vaccine  You may need this if you have certain risk factors. Human papillomavirus (HPV) vaccine  If recommended by your health care provider, you may need three doses over 6 months. You may receive vaccines as individual doses or as more than one vaccine together in one shot (combination vaccines). Talk with your health care provider about the risks and benefits of combination vaccines. What tests do I need? Blood tests  Lipid and cholesterol levels. These may be checked every 5 years, or more frequently if you are over 60 years old.  Hepatitis C test.  Hepatitis B test. Screening  Lung cancer screening. You may have this screening every year starting at age 43 if you have a 30-pack-year history of smoking and currently smoke or have quit within the past 15 years.  Prostate cancer screening. Recommendations will vary depending on your family history and other risks.  Colorectal cancer screening. All adults should have this screening starting at age 72 and continuing until age 2. Your health care provider may recommend screening at age 14 if you are at increased risk. You will have tests every 1-10 years, depending on your results and the type of screening test.  Diabetes screening. This is done by checking your blood sugar (glucose) after you have not eaten  for a while (fasting). You may have this done every 1-3 years.  Sexually transmitted disease (STD) testing. Follow these instructions at home: Eating and drinking  Eat a diet that includes fresh fruits and vegetables, whole grains, lean protein, and low-fat dairy products.  Take vitamin and mineral supplements as  recommended by your health care provider.  Do not drink alcohol if your health care provider tells you not to drink.  If you drink alcohol: ? Limit how much you have to 0-2 drinks a day. ? Be aware of how much alcohol is in your drink. In the [place] one drink equals one 12 oz bottle of beer (355 mL), one 5 oz glass of wine (148 mL), or one 1 oz glass of hard liquor (44 mL). Lifestyle  Take daily care of your teeth and gums.  Stay active. Exercise for at least 30 minutes on 5 or more days each week.  Do not use any products that contain nicotine or tobacco, such as cigarettes, e-cigarettes, and chewing tobacco. If you need help quitting, ask your health care provider.  If you are sexually active, practice safe sex. Use a condom or other form of protection to prevent STIs (sexually transmitted infections).  Talk with your health care provider about taking a low-dose aspirin every day starting at age 61. What's next?  Go to your health care provider once a year for a well check visit.  Ask your health care provider how often you should have your eyes and teeth checked.  Stay up to date on all vaccines. This information is not intended to replace advice given to you by your health care provider. Make sure you discuss any questions you have with your health care provider. Document Revised: 01/14/2018 Document Reviewed: 01/14/2018 Elsevier Patient Education  2020 [employer].  Managing Your Hypertension Hypertension is commonly called high blood pressure. This is when the force of your blood pressing against the walls of your arteries is too strong. Arteries are blood vessels that carry blood from your heart throughout your body. Hypertension forces the heart to work harder to pump blood, and may cause the arteries to become narrow or stiff. Having untreated or uncontrolled hypertension can cause heart attack, stroke, kidney disease, and other problems. What are blood pressure readings? A  blood pressure reading consists of a higher number over a lower number. Ideally, your blood pressure should be below 120/80. The first ("top") number is called the systolic pressure. It is a measure of the pressure in your arteries as your heart beats. The second ("bottom") number is called the diastolic pressure. It is a measure of the pressure in your arteries as the heart relaxes. What does my blood pressure reading mean? Blood pressure is classified into four stages. Based on your blood pressure reading, your health care provider may use the following stages to determine what type of treatment you need, if any. Systolic pressure and diastolic pressure are measured in a unit called mm Hg. Normal  Systolic pressure: below 762.  Diastolic pressure: below 80. Elevated  Systolic pressure: 831-517.  Diastolic pressure: below 80. Hypertension stage 1  Systolic pressure: 616-073.  Diastolic pressure: 71-06. Hypertension stage 2  Systolic pressure: 269 or above.  Diastolic pressure: 90 or above. What health risks are associated with hypertension? Managing your hypertension is an important responsibility. Uncontrolled hypertension can lead to:  A heart attack.  A stroke.  A weakened blood vessel (aneurysm).  Heart failure.  Kidney damage.  Eye damage.  Metabolic syndrome.  Memory and concentration problems. What changes can I make to manage my hypertension? Hypertension can be managed by making lifestyle changes and possibly by taking medicines. Your health care provider will help you make a plan to bring your blood pressure within a normal range. Eating and drinking   Eat a diet that is high in fiber and potassium, and low in salt (sodium), added sugar, and fat. An example eating plan is called the DASH (Dietary Approaches to Stop Hypertension) diet. To eat this way: ? Eat plenty of fresh fruits and vegetables. Try to fill half of your plate at each meal with fruits and  vegetables. ? Eat whole grains, such as whole wheat pasta, brown rice, or whole grain bread. Fill about one quarter of your plate with whole grains. ? Eat low-fat diary products. ? Avoid fatty cuts of meat, processed or cured meats, and poultry with skin. Fill about one quarter of your plate with lean proteins such as fish, chicken without skin, beans, eggs, and tofu. ? Avoid premade and processed foods. These tend to be higher in sodium, added sugar, and fat.  Reduce your daily sodium intake. Most people with hypertension should eat less than 1,500 mg of sodium a day.  Limit alcohol intake to no more than 1 drink a day for nonpregnant women and 2 drinks a day for men. One drink equals 12 oz of beer, 5 oz of wine, or 1 oz of hard liquor. Lifestyle  Work with your health care provider to maintain a healthy body weight, or to lose weight. Ask what an ideal weight is for you.  Get at least 30 minutes of exercise that causes your heart to beat faster (aerobic exercise) most days of the week. Activities may include walking, swimming, or biking.  Include exercise to strengthen your muscles (resistance exercise), such as weight lifting, as part of your weekly exercise routine. Try to do these types of exercises for 30 minutes at least 3 days a week.  Do not use any products that contain nicotine or tobacco, such as cigarettes and e-cigarettes. If you need help quitting, ask your health care provider.  Control any long-term (chronic) conditions you have, such as high cholesterol or diabetes. Monitoring  Monitor your blood pressure at home as told by your health care provider. Your personal target blood pressure may vary depending on your medical conditions, your age, and other factors.  Have your blood pressure checked regularly, as often as told by your health care provider. Working with your health care provider  Review all the medicines you take with your health care provider because there may  be side effects or interactions.  Talk with your health care provider about your diet, exercise habits, and other lifestyle factors that may be contributing to hypertension.  Visit your health care provider regularly. Your health care provider can help you create and adjust your plan for managing hypertension. Will I need medicine to control my blood pressure? Your health care provider may prescribe medicine if lifestyle changes are not enough to get your blood pressure under control, and if:  Your systolic blood pressure is 130 or higher.  Your diastolic blood pressure is 80 or higher. Take medicines only as told by your health care provider. Follow the directions carefully. Blood pressure medicines must be taken as prescribed. The medicine does not work as well when you skip doses. Skipping doses also puts you at risk for problems. Contact a health care provider if:  You think you are having a reaction to medicines you have taken.  You have repeated (recurrent) headaches.  You feel dizzy.  You have swelling in your ankles.  You have trouble with your vision. Get help right away if:  You develop a severe headache or confusion.  You have unusual weakness or numbness, or you feel faint.  You have severe pain in your chest or abdomen.  You vomit repeatedly.  You have trouble breathing. Summary  Hypertension is when the force of blood pumping through your arteries is too strong. If this condition is not controlled, it may put you at risk for serious complications.  Your personal target blood pressure may vary depending on your medical conditions, your age, and other factors. For most people, a normal blood pressure is less than 120/80.  Hypertension is managed by lifestyle changes, medicines, or both. Lifestyle changes include weight loss, eating a healthy, low-sodium diet, exercising more, and limiting alcohol. This information is not intended to replace advice given to you by  your health care provider. Make sure you discuss any questions you have with your health care provider. Document Revised: 05/14/2018 Document Reviewed: 12/19/2015 Elsevier Patient Education  [place].  Managing Your Hypertension Hypertension is commonly called high blood pressure. This is when the force of your blood pressing against the walls of your arteries is too strong. Arteries are blood vessels that carry blood from your heart throughout your body. Hypertension forces the heart to work harder to pump blood, and may cause the arteries to become narrow or stiff. Having untreated or uncontrolled hypertension can cause heart attack, stroke, kidney disease, and other problems. What are blood pressure readings? A blood pressure reading consists of a higher number over a lower number. Ideally, your blood pressure should be below 120/80. The first ("top") number is called the systolic pressure. It is a measure of the pressure in your arteries as your heart beats. The second ("bottom") number is called the diastolic pressure. It is a measure of the pressure in your arteries as the heart relaxes. What does my blood pressure reading mean? Blood pressure is classified into four stages. Based on your blood pressure reading, your health care provider may use the following stages to determine what type of treatment you need, if any. Systolic pressure and diastolic pressure are measured in a unit called mm Hg. Normal  Systolic pressure: below 629.  Diastolic pressure: below 80. Elevated  Systolic pressure: 528-413.  Diastolic pressure: below 80. Hypertension stage 1  Systolic pressure: 244-010.  Diastolic pressure: 27-25. Hypertension stage 2  Systolic pressure: 366 or above.  Diastolic pressure: 90 or above. What health risks are associated with hypertension? Managing your hypertension is an important responsibility. Uncontrolled hypertension can lead to:  A heart attack.  A  stroke.  A weakened blood vessel (aneurysm).  Heart failure.  Kidney damage.  Eye damage.  Metabolic syndrome.  Memory and concentration problems. What changes can I make to manage my hypertension? Hypertension can be managed by making lifestyle changes and possibly by taking medicines. Your health care provider will help you make a plan to bring your blood pressure within a normal range. Eating and drinking   Eat a diet that is high in fiber and potassium, and low in salt (sodium), added sugar, and fat. An example eating plan is called the DASH (Dietary Approaches to Stop Hypertension) diet. To eat this way: ? Eat plenty of fresh fruits and vegetables. Try to fill half  of your plate at each meal with fruits and vegetables. ? Eat whole grains, such as whole wheat pasta, brown rice, or whole grain bread. Fill about one quarter of your plate with whole grains. ? Eat low-fat diary products. ? Avoid fatty cuts of meat, processed or cured meats, and poultry with skin. Fill about one quarter of your plate with lean proteins such as fish, chicken without skin, beans, eggs, and tofu. ? Avoid premade and processed foods. These tend to be higher in sodium, added sugar, and fat.  Reduce your daily sodium intake. Most people with hypertension should eat less than 1,500 mg of sodium a day.  Limit alcohol intake to no more than 1 drink a day for nonpregnant women and 2 drinks a day for men. One drink equals 12 oz of beer, 5 oz of wine, or 1 oz of hard liquor. Lifestyle  Work with your health care provider to maintain a healthy body weight, or to lose weight. Ask what an ideal weight is for you.  Get at least 30 minutes of exercise that causes your heart to beat faster (aerobic exercise) most days of the week. Activities may include walking, swimming, or biking.  Include exercise to strengthen your muscles (resistance exercise), such as weight lifting, as part of your weekly exercise routine. Try  to do these types of exercises for 30 minutes at least 3 days a week.  Do not use any products that contain nicotine or tobacco, such as cigarettes and e-cigarettes. If you need help quitting, ask your health care provider.  Control any long-term (chronic) conditions you have, such as high cholesterol or diabetes. Monitoring  Monitor your blood pressure at home as told by your health care provider. Your personal target blood pressure may vary depending on your medical conditions, your age, and other factors.  Have your blood pressure checked regularly, as often as told by your health care provider. Working with your health care provider  Review all the medicines you take with your health care provider because there may be side effects or interactions.  Talk with your health care provider about your diet, exercise habits, and other lifestyle factors that may be contributing to hypertension.  Visit your health care provider regularly. Your health care provider can help you create and adjust your plan for managing hypertension. Will I need medicine to control my blood pressure? Your health care provider may prescribe medicine if lifestyle changes are not enough to get your blood pressure under control, and if:  Your systolic blood pressure is 130 or higher.  Your diastolic blood pressure is 80 or higher. Take medicines only as told by your health care provider. Follow the directions carefully. Blood pressure medicines must be taken as prescribed. The medicine does not work as well when you skip doses. Skipping doses also puts you at risk for problems. Contact a health care provider if:  You think you are having a reaction to medicines you have taken.  You have repeated (recurrent) headaches.  You feel dizzy.  You have swelling in your ankles.  You have trouble with your vision. Get help right away if:  You develop a severe headache or confusion.  You have unusual weakness or  numbness, or you feel faint.  You have severe pain in your chest or abdomen.  You vomit repeatedly.  You have trouble breathing. Summary  Hypertension is when the force of blood pumping through your arteries is too strong. If this condition is not controlled,  it may put you at risk for serious complications.  Your personal target blood pressure may vary depending on your medical conditions, your age, and other factors. For most people, a normal blood pressure is less than 120/80.  Hypertension is managed by lifestyle changes, medicines, or both. Lifestyle changes include weight loss, eating a healthy, low-sodium diet, exercising more, and limiting alcohol. This information is not intended to replace advice given to you by your health care provider. Make sure you discuss any questions you have with your health care provider. Document Revised: 05/14/2018 Document Reviewed: 12/19/2015 Elsevier Patient Education  [place].  Chronic Pain, Adult Chronic pain is a type of pain that lasts or keeps coming back (recurs) for at least six months. You may have chronic headaches, abdominal pain, or body pain. Chronic pain may be related to an illness, such as fibromyalgia or complex regional pain syndrome. Sometimes the cause of chronic pain is not known. Chronic pain can make it hard for you to do daily activities. If not treated, chronic pain can lead to other health problems, including anxiety and depression. Treatment depends on the cause and severity of your pain. You may need to work with a pain specialist to come up with a treatment plan. The plan may include medicine, counseling, and physical therapy. Many people benefit from a combination of two or more types of treatment to control their pain. Follow these instructions at home: Lifestyle  Consider keeping a pain diary to share with your health care providers.  Consider talking with a mental health care provider (psychologist) about  how to cope with chronic pain.  Consider joining a chronic pain support group.  Try to control or lower your stress levels. Talk to your health care provider about strategies to do this. General instructions   Take over-the-counter and prescription medicines only as told by your health care provider.  Follow your treatment plan as told by your health care provider. This may include: ? Gentle, regular exercise. ? Eating a healthy diet that includes foods such as vegetables, fruits, fish, and lean meats. ? Cognitive or behavioral therapy. ? Working with a Community education officer. ? Meditation or yoga. ? Acupuncture or massage therapy. ? Aroma, color, light, or sound therapy. ? Local electrical stimulation. ? Shots (injections) of numbing or pain-relieving medicines into the spine or the area of pain.  Check your pain level as told by your health care provider. Ask your health care provider if you should use a pain scale.  Learn as much as you can about how to manage your chronic pain. Ask your health care provider if an intensive pain rehabilitation program or a chronic pain specialist would be helpful.  Keep all follow-up visits as told by your health care provider. This is important. Contact a health care provider if:  Your pain gets worse.  You have new pain.  You have trouble sleeping.  You have trouble doing your normal activities.  Your pain is not controlled with treatment.  Your have side effects from pain medicine.  You feel weak. Get help right away if:  You lose feeling or have numbness in your body.  You lose control of bowel or bladder function.  Your pain suddenly gets much worse.  You develop shaking or chills.  You develop confusion.  You develop chest pain.  You have trouble breathing or shortness of breath.  You pass out.  You have thoughts about hurting yourself or others. This information is not intended  to replace advice given to you by your  health care provider. Make sure you discuss any questions you have with your health care provider. Document Revised: 01/02/2017 Document Reviewed: 07/10/2015 Elsevier Patient Education  [place].  How to Take Your Blood Pressure You can take your blood pressure at home with a machine. You may need to check your blood pressure at home:  To check if you have high blood pressure (hypertension).  To check your blood pressure over time.  To make sure your blood pressure medicine is working. Supplies needed: You will need a blood pressure machine, or monitor. You can buy one at a drugstore or online. When choosing one:  Choose one with an arm cuff.  Choose one that wraps around your upper arm. Only one finger should fit between your arm and the cuff.  Do not choose one that measures your blood pressure from your wrist or finger. Your doctor can suggest a monitor. How to prepare Avoid these things for 30 minutes before checking your blood pressure:  Drinking caffeine.  Drinking alcohol.  Eating.  Smoking.  Exercising. Five minutes before checking your blood pressure:  Pee.  Sit in a dining chair. Avoid sitting in a soft couch or armchair.  Be quiet. Do not talk. How to take your blood pressure Follow the instructions that came with your machine. If you have a digital blood pressure monitor, these may be the instructions: 1. Sit up straight. 2. Place your feet on the floor. Do not cross your ankles or legs. 3. Rest your left arm at the level of your heart. You may rest it on a table, desk, or chair. 4. Pull up your shirt sleeve. 5. Wrap the blood pressure cuff around the upper part of your left arm. The cuff should be 1 inch (2.5 cm) above your elbow. It is best to wrap the cuff around bare skin. 6. Fit the cuff snugly around your arm. You should be able to place only one finger between the cuff and your arm. 7. Put the cord inside the groove of your elbow. 8. Press  the power button. 9. Sit quietly while the cuff fills with air and loses air. 10. Write down the numbers on the screen. 11. Wait 2-3 minutes and then repeat steps 1-10. What do the numbers mean? Two numbers make up your blood pressure. The first number is called systolic pressure. The second is called diastolic pressure. An example of a blood pressure reading is "120 over 80" (or 120/80). If you are an adult and do not have a medical condition, use this guide to find out if your blood pressure is normal: Normal  First number: below 120.  Second number: below 80. Elevated  First number: 120-129.  Second number: below 80. Hypertension stage 1  First number: 130-139.  Second number: 80-89. Hypertension stage 2  First number: 140 or above.  Second number: 64 or above. Your blood pressure is above normal even if only the top or bottom number is above normal. Follow these instructions at home:  Check your blood pressure as often as your doctor tells you to.  Take your monitor to your next doctor's appointment. Your doctor will: ? Make sure you are using it correctly. ? Make sure it is working right.  Make sure you understand what your blood pressure numbers should be.  Tell your doctor if your medicines are causing side effects. Contact a doctor if:  Your blood pressure keeps being high.  Get help right away if:  Your first blood pressure number is higher than 180.  Your second blood pressure number is higher than 120. This information is not intended to replace advice given to you by your health care provider. Make sure you discuss any questions you have with your health care provider. Document Revised: 01/02/2017 Document Reviewed: 06/29/2015 Elsevier Patient Education  2020 [employer].

## 2019-07-08 LAB — RPR: RPR Ser Ql: NONREACTIVE

## 2019-07-08 LAB — HIV ANTIBODY (ROUTINE TESTING W REFLEX): HIV 1&2 Ab, 4th Generation: NONREACTIVE

## 2019-07-08 LAB — HEPATITIS C ANTIBODY
Hepatitis C Ab: NONREACTIVE
SIGNAL TO CUT-OFF: 0.48 (ref <1.00)

## 2019-07-08 LAB — C. TRACHOMATIS/N. GONORRHOEAE RNA
C. trachomatis RNA, TMA: NOT DETECTED
N. gonorrhoeae RNA, TMA: NOT DETECTED

## 2019-07-13 DIAGNOSIS — M79604 Pain in right leg: Secondary | ICD-10-CM | POA: Diagnosis not present

## 2019-07-13 DIAGNOSIS — Z79899 Other long term (current) drug therapy: Secondary | ICD-10-CM | POA: Diagnosis not present

## 2019-07-13 DIAGNOSIS — M5136 Other intervertebral disc degeneration, lumbar region: Secondary | ICD-10-CM | POA: Diagnosis not present

## 2019-07-13 DIAGNOSIS — G894 Chronic pain syndrome: Secondary | ICD-10-CM | POA: Diagnosis not present

## 2019-07-13 DIAGNOSIS — Z79891 Long term (current) use of opiate analgesic: Secondary | ICD-10-CM | POA: Diagnosis not present

## 2019-07-13 DIAGNOSIS — M4600 Spinal enthesopathy, site unspecified: Secondary | ICD-10-CM | POA: Diagnosis not present

## 2019-07-16 ENCOUNTER — Other Ambulatory Visit: Payer: Self-pay | Admitting: Family Medicine

## 2019-07-16 DIAGNOSIS — I10 Essential (primary) hypertension: Secondary | ICD-10-CM

## 2019-08-22 ENCOUNTER — Ambulatory Visit (INDEPENDENT_AMBULATORY_CARE_PROVIDER_SITE_OTHER): Payer: Medicare Other

## 2019-08-22 ENCOUNTER — Other Ambulatory Visit: Payer: Self-pay

## 2019-08-22 DIAGNOSIS — Z0000 Encounter for general adult medical examination without abnormal findings: Secondary | ICD-10-CM

## 2019-08-22 NOTE — Patient Instructions (Signed)
[name] , Thank you for taking time to come for your Medicare Wellness Visit. I appreciate your ongoing commitment to your health goals. Please review the following plan we discussed and let me know if I can assist you in the future.   Screening recommendations/referrals: Colonoscopy: Up to date, next due 09/23/2025 Recommended yearly ophthalmology/optometry visit for glaucoma screening and checkup Recommended yearly dental visit for hygiene and checkup  Vaccinations: Influenza vaccine: Up to date, next due 09/2019 Pneumococcal vaccine: Due, you may receive the first of the series at your next in person office visit Tdap vaccine: Up to date date next due 09/19/2025 Shingles vaccine: Due, please check with your insurance or pharmacy on cost of vaccine     Advanced directives: Advance directive discussed with you today. Even though you declined this today please call our office should you change your mind and we can give you the proper paperwork for you to fill out.   Conditions/risks identified: None   Next appointment: None  Preventive Care 40-64 Years, Male Preventive care refers to lifestyle choices and visits with your health care provider that can promote health and wellness. What does preventive care include?  A yearly physical exam. This is also called an annual well check.  Dental exams once or twice a year.  Routine eye exams. Ask your health care provider how often you should have your eyes checked.  Personal lifestyle choices, including:  Daily care of your teeth and gums.  Regular physical activity.  Eating a healthy diet.  Avoiding tobacco and drug use.  Limiting alcohol use.  Practicing safe sex.  Taking low-dose aspirin every day starting at age 65. What happens during an annual well check? The services and screenings done by your health care provider during your annual well check will depend on your age, overall health, lifestyle risk factors, and  family history of disease. Counseling  Your health care provider may ask you questions about your:  Alcohol use.  Tobacco use.  Drug use.  Emotional well-being.  Home and relationship well-being.  Sexual activity.  Eating habits.  Work and work Statistician. Screening  You may have the following tests or measurements:  Height, weight, and BMI.  Blood pressure.  Lipid and cholesterol levels. These may be checked every 5 years, or more frequently if you are over 66 years old.  Skin check.  Lung cancer screening. You may have this screening every year starting at age 29 if you have a 30-pack-year history of smoking and currently smoke or have quit within the past 15 years.  Fecal occult blood test (FOBT) of the stool. You may have this test every year starting at age 84.  Flexible sigmoidoscopy or colonoscopy. You may have a sigmoidoscopy every 5 years or a colonoscopy every 10 years starting at age 82.  Prostate cancer screening. Recommendations will vary depending on your family history and other risks.  Hepatitis C blood test.  Hepatitis B blood test.  Sexually transmitted disease (STD) testing.  Diabetes screening. This is done by checking your blood sugar (glucose) after you have not eaten for a while (fasting). You may have this done every 1-3 years. Discuss your test results, treatment options, and if necessary, the need for more tests with your health care provider. Vaccines  Your health care provider may recommend certain vaccines, such as:  Influenza vaccine. This is recommended every year.  Tetanus, diphtheria, and acellular pertussis (Tdap, Td) vaccine. You may need a Td booster every 10  years.  Zoster vaccine. You may need this after age 36.  Pneumococcal 13-valent conjugate (PCV13) vaccine. You may need this if you have certain conditions and have not been vaccinated.  Pneumococcal polysaccharide (PPSV23) vaccine. You may need one or two doses if you  smoke cigarettes or if you have certain conditions. Talk to your health care provider about which screenings and vaccines you need and how often you need them. This information is not intended to replace advice given to you by your health care provider. Make sure you discuss any questions you have with your health care provider. Document Released: 02/16/2015 Document Revised: 10/10/2015 Document Reviewed: 11/21/2014 Elsevier Interactive Patient Education  2017 [employer] Prevention in the Home Falls can cause injuries. They can happen to people of all ages. There are many things you can do to make your home safe and to help prevent falls. What can I do on the outside of my home?  Regularly fix the edges of walkways and driveways and fix any cracks.  Remove anything that might make you trip as you walk through a door, such as a raised step or threshold.  Trim any bushes or trees on the path to your home.  Use bright outdoor lighting.  Clear any walking paths of anything that might make someone trip, such as rocks or tools.  Regularly check to see if handrails are loose or broken. Make sure that both sides of any steps have handrails.  Any raised decks and porches should have guardrails on the edges.  Have any leaves, snow, or ice cleared regularly.  Use sand or salt on walking paths during winter.  Clean up any spills in your garage right away. This includes oil or grease spills. What can I do in the bathroom?  Use night lights.  Install grab bars by the toilet and in the tub and shower. Do not use towel bars as grab bars.  Use non-skid mats or decals in the tub or shower.  If you need to sit down in the shower, use a plastic, non-slip stool.  Keep the floor dry. Clean up any water that spills on the floor as soon as it happens.  Remove soap buildup in the tub or shower regularly.  Attach bath mats securely with double-sided non-slip rug tape.  Do not have throw  rugs and other things on the floor that can make you trip. What can I do in the bedroom?  Use night lights.  Make sure that you have a light by your bed that is easy to reach.  Do not use any sheets or blankets that are too big for your bed. They should not hang down onto the floor.  Have a firm chair that has side arms. You can use this for support while you get dressed.  Do not have throw rugs and other things on the floor that can make you trip. What can I do in the kitchen?  Clean up any spills right away.  Avoid walking on wet floors.  Keep items that you use a lot in easy-to-reach places.  If you need to reach something above you, use a strong step stool that has a grab bar.  Keep electrical cords out of the way.  Do not use floor polish or wax that makes floors slippery. If you must use wax, use non-skid floor wax.  Do not have throw rugs and other things on the floor that can make you trip. What can I  do with my stairs?  Do not leave any items on the stairs.  Make sure that there are handrails on both sides of the stairs and use them. Fix handrails that are broken or loose. Make sure that handrails are as long as the stairways.  Check any carpeting to make sure that it is firmly attached to the stairs. Fix any carpet that is loose or worn.  Avoid having throw rugs at the top or bottom of the stairs. If you do have throw rugs, attach them to the floor with carpet tape.  Make sure that you have a light switch at the top of the stairs and the bottom of the stairs. If you do not have them, ask someone to add them for you. What else can I do to help prevent falls?  Wear shoes that:  Do not have high heels.  Have rubber bottoms.  Are comfortable and fit you well.  Are closed at the toe. Do not wear sandals.  If you use a stepladder:  Make sure that it is fully opened. Do not climb a closed stepladder.  Make sure that both sides of the stepladder are locked into  place.  Ask someone to hold it for you, if possible.  Clearly mark and make sure that you can see:  Any grab bars or handrails.  First and last steps.  Where the edge of each step is.  Use tools that help you move around (mobility aids) if they are needed. These include:  Canes.  Walkers.  Scooters.  Crutches.  Turn on the lights when you go into a dark area. Replace any light bulbs as soon as they burn out.  Set up your furniture so you have a clear path. Avoid moving your furniture around.  If any of your floors are uneven, fix them.  If there are any pets around you, be aware of where they are.  Review your medicines with your doctor. Some medicines can make you feel dizzy. This can increase your chance of falling. Ask your doctor what other things that you can do to help prevent falls. This information is not intended to replace advice given to you by your health care provider. Make sure you discuss any questions you have with your health care provider. Document Released: 11/16/2008 Document Revised: 06/28/2015 Document Reviewed: 02/24/2014 Elsevier Interactive Patient Education  2017 [employer].
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
## 2019-09-05 ENCOUNTER — Telehealth: Payer: Self-pay | Admitting: Family Medicine

## 2019-09-05 NOTE — Telephone Encounter (Signed)
Pt is calling to have medication sildenafil (VIAGRA) 100 MG tablet   Refilled.   Please advise.

## 2019-09-05 NOTE — Telephone Encounter (Signed)
Pt LOV was on 07/07/2019 and last refill was done on 07/02/2019 for 30 tablets with 2 refills, please advise if ok to refill

## 2019-09-06 NOTE — Telephone Encounter (Signed)
Pt was advised to f/u for HTN in 1 month.  Will wait on refill.

## 2019-09-07 DIAGNOSIS — M79604 Pain in right leg: Secondary | ICD-10-CM | POA: Diagnosis not present

## 2019-09-07 DIAGNOSIS — Z79899 Other long term (current) drug therapy: Secondary | ICD-10-CM | POA: Diagnosis not present

## 2019-09-07 DIAGNOSIS — Z79891 Long term (current) use of opiate analgesic: Secondary | ICD-10-CM | POA: Diagnosis not present

## 2019-09-07 DIAGNOSIS — G894 Chronic pain syndrome: Secondary | ICD-10-CM | POA: Diagnosis not present

## 2019-09-07 DIAGNOSIS — M25559 Pain in unspecified hip: Secondary | ICD-10-CM | POA: Diagnosis not present

## 2019-09-07 DIAGNOSIS — M5136 Other intervertebral disc degeneration, lumbar region: Secondary | ICD-10-CM | POA: Diagnosis not present

## 2019-09-07 NOTE — Telephone Encounter (Signed)
FYI Spoke with pt not pleased that he has to wait for his Sildenafil refill until his f/u visit for BP check, pt state that Rx has nothing to do with his BP and that the medication is helping with her marriage. Pt is scheduled for  f/u on his BP on 09/08/2019.

## 2019-09-08 ENCOUNTER — Encounter: Payer: Self-pay | Admitting: Family Medicine

## 2019-09-08 ENCOUNTER — Ambulatory Visit (INDEPENDENT_AMBULATORY_CARE_PROVIDER_SITE_OTHER): Payer: Medicare Other | Admitting: Family Medicine

## 2019-09-08 ENCOUNTER — Other Ambulatory Visit: Payer: Self-pay

## 2019-09-08 VITALS — BP 144/81 | HR 59 | Temp 98.2°F | Ht 75.0 in | Wt 233.0 lb

## 2019-09-08 DIAGNOSIS — I10 Essential (primary) hypertension: Secondary | ICD-10-CM

## 2019-09-08 DIAGNOSIS — N529 Male erectile dysfunction, unspecified: Secondary | ICD-10-CM

## 2019-09-08 DIAGNOSIS — G8929 Other chronic pain: Secondary | ICD-10-CM

## 2019-09-08 MED ORDER — AMLODIPINE BESYLATE 5 MG PO TABS: 5.0000 mg | ORAL_TABLET | Freq: Every day | ORAL | 1 refills | Status: AC

## 2019-09-08 MED ORDER — SILDENAFIL CITRATE 100 MG PO TABS: 100.0000 mg | ORAL_TABLET | Freq: Every day | ORAL | 2 refills | Status: AC | PRN

## 2019-09-08 NOTE — Patient Instructions (Addendum)
Starting tomorrow you should take Norvasc 5 mg and losartan 100 mg daily.  You can take two of the Norvasc 2.5 mg tabs that you have at home.  A new prescription 25 mg Norvasc tablets was sent to your pharmacy.  You should also start checking your blood pressure daily and bring your machine with you to your next appointment in 1 month.  Continue to look at the amount of salt in the items you are eating.  A good goal is 2000 mg of salt or less in a day. How to Take Your Blood Pressure You can take your blood pressure at home with a machine. You may need to check your blood pressure at home:  To check if you have high blood pressure (hypertension).  To check your blood pressure over time.  To make sure your blood pressure medicine is working. Supplies needed: You will need a blood pressure machine, or monitor. You can buy one at a drugstore or online. When choosing one:  Choose one with an arm cuff.  Choose one that wraps around your upper arm. Only one finger should fit between your arm and the cuff.  Do not choose one that measures your blood pressure from your wrist or finger. Your doctor can suggest a monitor. How to prepare Avoid these things for 30 minutes before checking your blood pressure:  Drinking caffeine.  Drinking alcohol.  Eating.  Smoking.  Exercising. Five minutes before checking your blood pressure:  Pee.  Sit in a dining chair. Avoid sitting in a soft couch or armchair.  Be quiet. Do not talk. How to take your blood pressure Follow the instructions that came with your machine. If you have a digital blood pressure monitor, these may be the instructions: 1. Sit up straight. 2. Place your feet on the floor. Do not cross your ankles or legs. 3. Rest your left arm at the level of your heart. You may rest it on a table, desk, or chair. 4. Pull up your shirt sleeve. 5. Wrap the blood pressure cuff around the upper part of your left arm. The cuff should be 1  inch (2.5 cm) above your elbow. It is best to wrap the cuff around bare skin. 6. Fit the cuff snugly around your arm. You should be able to place only one finger between the cuff and your arm. 7. Put the cord inside the groove of your elbow. 8. Press the power button. 9. Sit quietly while the cuff fills with air and loses air. 10. Write down the numbers on the screen. 11. Wait 2-3 minutes and then repeat steps 1-10. What do the numbers mean? Two numbers make up your blood pressure. The first number is called systolic pressure. The second is called diastolic pressure. An example of a blood pressure reading is "120 over 80" (or 120/80). If you are an adult and do not have a medical condition, use this guide to find out if your blood pressure is normal: Normal  First number: below 120.  Second number: below 80. Elevated  First number: 120-129.  Second number: below 80. Hypertension stage 1  First number: 130-139.  Second number: 80-89. Hypertension stage 2  First number: 140 or above.  Second number: 31 or above. Your blood pressure is above normal even if only the top or bottom number is above normal. Follow these instructions at home:  Check your blood pressure as often as your doctor tells you to.  Take your monitor to your  next doctor's appointment. Your doctor will: ? Make sure you are using it correctly. ? Make sure it is working right.  Make sure you understand what your blood pressure numbers should be.  Tell your doctor if your medicines are causing side effects. Contact a doctor if:  Your blood pressure keeps being high. Get help right away if:  Your first blood pressure number is higher than 180.  Your second blood pressure number is higher than 120. This information is not intended to replace advice given to you by your health care provider. Make sure you discuss any questions you have with your health care provider. Document Revised: 01/02/2017 Document  Reviewed: 06/29/2015 Elsevier Patient Education  2020 [employer].  Managing Your Hypertension Hypertension is commonly called high blood pressure. This is when the force of your blood pressing against the walls of your arteries is too strong. Arteries are blood vessels that carry blood from your heart throughout your body. Hypertension forces the heart to work harder to pump blood, and may cause the arteries to become narrow or stiff. Having untreated or uncontrolled hypertension can cause heart attack, stroke, kidney disease, and other problems. What are blood pressure readings? A blood pressure reading consists of a higher number over a lower number. Ideally, your blood pressure should be below 120/80. The first ("top") number is called the systolic pressure. It is a measure of the pressure in your arteries as your heart beats. The second ("bottom") number is called the diastolic pressure. It is a measure of the pressure in your arteries as the heart relaxes. What does my blood pressure reading mean? Blood pressure is classified into four stages. Based on your blood pressure reading, your health care provider may use the following stages to determine what type of treatment you need, if any. Systolic pressure and diastolic pressure are measured in a unit called mm Hg. Normal  Systolic pressure: below 244.  Diastolic pressure: below 80. Elevated  Systolic pressure: 010-272.  Diastolic pressure: below 80. Hypertension stage 1  Systolic pressure: 536-644.  Diastolic pressure: 03-47. Hypertension stage 2  Systolic pressure: 425 or above.  Diastolic pressure: 90 or above. What health risks are associated with hypertension? Managing your hypertension is an important responsibility. Uncontrolled hypertension can lead to:  A heart attack.  A stroke.  A weakened blood vessel (aneurysm).  Heart failure.  Kidney damage.  Eye damage.  Metabolic syndrome.  Memory and  concentration problems. What changes can I make to manage my hypertension? Hypertension can be managed by making lifestyle changes and possibly by taking medicines. Your health care provider will help you make a plan to bring your blood pressure within a normal range. Eating and drinking   Eat a diet that is high in fiber and potassium, and low in salt (sodium), added sugar, and fat. An example eating plan is called the DASH (Dietary Approaches to Stop Hypertension) diet. To eat this way: ? Eat plenty of fresh fruits and vegetables. Try to fill half of your plate at each meal with fruits and vegetables. ? Eat whole grains, such as whole wheat pasta, brown rice, or whole grain bread. Fill about one quarter of your plate with whole grains. ? Eat low-fat diary products. ? Avoid fatty cuts of meat, processed or cured meats, and poultry with skin. Fill about one quarter of your plate with lean proteins such as fish, chicken without skin, beans, eggs, and tofu. ? Avoid premade and processed foods. These tend to be  higher in sodium, added sugar, and fat.  Reduce your daily sodium intake. Most people with hypertension should eat less than 1,500 mg of sodium a day.  Limit alcohol intake to no more than 1 drink a day for nonpregnant women and 2 drinks a day for men. One drink equals 12 oz of beer, 5 oz of wine, or 1 oz of hard liquor. Lifestyle  Work with your health care provider to maintain a healthy body weight, or to lose weight. Ask what an ideal weight is for you.  Get at least 30 minutes of exercise that causes your heart to beat faster (aerobic exercise) most days of the week. Activities may include walking, swimming, or biking.  Include exercise to strengthen your muscles (resistance exercise), such as weight lifting, as part of your weekly exercise routine. Try to do these types of exercises for 30 minutes at least 3 days a week.  Do not use any products that contain nicotine or tobacco,  such as cigarettes and e-cigarettes. If you need help quitting, ask your health care provider.  Control any long-term (chronic) conditions you have, such as high cholesterol or diabetes. Monitoring  Monitor your blood pressure at home as told by your health care provider. Your personal target blood pressure may vary depending on your medical conditions, your age, and other factors.  Have your blood pressure checked regularly, as often as told by your health care provider. Working with your health care provider  Review all the medicines you take with your health care provider because there may be side effects or interactions.  Talk with your health care provider about your diet, exercise habits, and other lifestyle factors that may be contributing to hypertension.  Visit your health care provider regularly. Your health care provider can help you create and adjust your plan for managing hypertension. Will I need medicine to control my blood pressure? Your health care provider may prescribe medicine if lifestyle changes are not enough to get your blood pressure under control, and if:  Your systolic blood pressure is 130 or higher.  Your diastolic blood pressure is 80 or higher. Take medicines only as told by your health care provider. Follow the directions carefully. Blood pressure medicines must be taken as prescribed. The medicine does not work as well when you skip doses. Skipping doses also puts you at risk for problems. Contact a health care provider if:  You think you are having a reaction to medicines you have taken.  You have repeated (recurrent) headaches.  You feel dizzy.  You have swelling in your ankles.  You have trouble with your vision. Get help right away if:  You develop a severe headache or confusion.  You have unusual weakness or numbness, or you feel faint.  You have severe pain in your chest or abdomen.  You vomit repeatedly.  You have trouble  breathing. Summary  Hypertension is when the force of blood pumping through your arteries is too strong. If this condition is not controlled, it may put you at risk for serious complications.  Your personal target blood pressure may vary depending on your medical conditions, your age, and other factors. For most people, a normal blood pressure is less than 120/80.  Hypertension is managed by lifestyle changes, medicines, or both. Lifestyle changes include weight loss, eating a healthy, low-sodium diet, exercising more, and limiting alcohol. This information is not intended to replace advice given to you by your health care provider. Make sure you discuss any  questions you have with your health care provider. Document Revised: 05/14/2018 Document Reviewed: 12/19/2015 Elsevier Patient Education  [place] DASH stands for "Dietary Approaches to Stop Hypertension." The DASH eating plan is a healthy eating plan that has been shown to reduce high blood pressure (hypertension). It may also reduce your risk for type 2 diabetes, heart disease, and stroke. The DASH eating plan may also help with weight loss. What are tips for following this plan?  General guidelines  Avoid eating more than 2,300 mg (milligrams) of salt (sodium) a day. If you have hypertension, you may need to reduce your sodium intake to 1,500 mg a day.  Limit alcohol intake to no more than 1 drink a day for nonpregnant women and 2 drinks a day for men. One drink equals 12 oz of beer, 5 oz of wine, or 1 oz of hard liquor.  Work with your health care provider to maintain a healthy body weight or to lose weight. Ask what an ideal weight is for you.  Get at least 30 minutes of exercise that causes your heart to beat faster (aerobic exercise) most days of the week. Activities may include walking, swimming, or biking.  Work with your health care provider or diet and nutrition specialist (dietitian) to adjust  your eating plan to your individual calorie needs. Reading food labels   Check food labels for the amount of sodium per serving. Choose foods with less than 5 percent of the Daily Value of sodium. Generally, foods with less than 300 mg of sodium per serving fit into this eating plan.  To find whole grains, look for the word "whole" as the first word in the ingredient list. Shopping  Buy products labeled as "low-sodium" or "no salt added."  Buy fresh foods. Avoid canned foods and premade or frozen meals. Cooking  Avoid adding salt when cooking. Use salt-free seasonings or herbs instead of table salt or sea salt. Check with your health care provider or pharmacist before using salt substitutes.  Do not fry foods. Cook foods using healthy methods such as baking, boiling, grilling, and broiling instead.  Cook with heart-healthy oils, such as olive, canola, soybean, or sunflower oil. Meal planning  Eat a balanced diet that includes: ? 5 or more servings of fruits and vegetables each day. At each meal, try to fill half of your plate with fruits and vegetables. ? Up to 6-8 servings of whole grains each day. ? Less than 6 oz of lean meat, poultry, or fish each day. A 3-oz serving of meat is about the same size as a deck of cards. One egg equals 1 oz. ? 2 servings of low-fat dairy each day. ? A serving of nuts, seeds, or beans 5 times each week. ? Heart-healthy fats. Healthy fats called Omega-3 fatty acids are found in foods such as flaxseeds and coldwater fish, like sardines, salmon, and mackerel.  Limit how much you eat of the following: ? Canned or prepackaged foods. ? Food that is high in trans fat, such as fried foods. ? Food that is high in saturated fat, such as fatty meat. ? Sweets, desserts, sugary drinks, and other foods with added sugar. ? Full-fat dairy products.  Do not salt foods before eating.  Try to eat at least 2 vegetarian meals each week.  Eat more home-cooked food  and less restaurant, buffet, and fast food.  When eating at a restaurant, ask that your food be prepared with less salt  or no salt, if possible. What foods are recommended? The items listed may not be a complete list. Talk with your dietitian about what dietary choices are best for you. Grains Whole-grain or whole-wheat bread. Whole-grain or whole-wheat pasta. Brown rice. [place]. Bulgur. Whole-grain and low-sodium cereals. Pita bread. Low-fat, low-sodium crackers. Whole-wheat flour tortillas. Vegetables Fresh or frozen vegetables (raw, steamed, roasted, or grilled). Low-sodium or reduced-sodium tomato and vegetable juice. Low-sodium or reduced-sodium tomato sauce and tomato paste. Low-sodium or reduced-sodium canned vegetables. Fruits All fresh, dried, or frozen fruit. Canned fruit in natural juice (without added sugar). Meat and other protein foods Skinless chicken or [place]. Ground chicken or [place]. Pork with fat trimmed off. Fish and seafood. Egg whites. Dried beans, peas, or lentils. Unsalted nuts, nut butters, and seeds. Unsalted canned beans. Lean cuts of beef with fat trimmed off. Low-sodium, lean deli meat. Dairy Low-fat (1%) or fat-free (skim) milk. Fat-free, low-fat, or reduced-fat cheeses. Nonfat, low-sodium ricotta or cottage cheese. Low-fat or nonfat yogurt. Low-fat, low-sodium cheese. Fats and oils Soft margarine without trans fats. Vegetable oil. Low-fat, reduced-fat, or light mayonnaise and salad dressings (reduced-sodium). Canola, safflower, olive, soybean, and sunflower oils. Avocado. Seasoning and other foods Herbs. Spices. Seasoning mixes without salt. Unsalted popcorn and pretzels. Fat-free sweets. What foods are not recommended? The items listed may not be a complete list. Talk with your dietitian about what dietary choices are best for you. Grains Baked goods made with fat, such as croissants, muffins, or some breads. Dry pasta or rice meal  packs. Vegetables Creamed or fried vegetables. Vegetables in a cheese sauce. Regular canned vegetables (not low-sodium or reduced-sodium). Regular canned tomato sauce and paste (not low-sodium or reduced-sodium). Regular tomato and vegetable juice (not low-sodium or reduced-sodium). Angie Fava. Olives. Fruits Canned fruit in a light or heavy syrup. Fried fruit. Fruit in cream or butter sauce. Meat and other protein foods Fatty cuts of meat. Ribs. Fried meat. Berniece Salines. Sausage. Bologna and other processed lunch meats. Salami. Fatback. Hotdogs. Bratwurst. Salted nuts and seeds. Canned beans with added salt. Canned or smoked fish. Whole eggs or egg yolks. Chicken or [place] with skin. Dairy Whole or 2% milk, cream, and half-and-half. Whole or full-fat cream cheese. Whole-fat or sweetened yogurt. Full-fat cheese. Nondairy creamers. Whipped toppings. Processed cheese and cheese spreads. Fats and oils Butter. Stick margarine. Lard. Shortening. Ghee. Bacon fat. Tropical oils, such as coconut, palm kernel, or palm oil. Seasoning and other foods Salted popcorn and pretzels. Onion salt, garlic salt, seasoned salt, table salt, and sea salt. Worcestershire sauce. Tartar sauce. Barbecue sauce. Teriyaki sauce. Soy sauce, including reduced-sodium. Steak sauce. Canned and packaged gravies. Fish sauce. Oyster sauce. Cocktail sauce. Horseradish that you find on the shelf. Ketchup. Mustard. Meat flavorings and tenderizers. Bouillon cubes. Hot sauce and Tabasco sauce. Premade or packaged marinades. Premade or packaged taco seasonings. Relishes. Regular salad dressings. Where to find more information:  National Heart, Lung, and [place]: https://wilson-eaton.com/  American Heart Association: www.heart.org Summary  The DASH eating plan is a healthy eating plan that has been shown to reduce high blood pressure (hypertension). It may also reduce your risk for type 2 diabetes, heart disease, and stroke.  With the DASH eating  plan, you should limit salt (sodium) intake to 2,300 mg a day. If you have hypertension, you may need to reduce your sodium intake to 1,500 mg a day.  When on the DASH eating plan, aim to eat more fresh fruits and vegetables, whole grains, lean proteins, low-fat dairy,  and heart-healthy fats.  Work with your health care provider or diet and nutrition specialist (dietitian) to adjust your eating plan to your individual calorie needs. This information is not intended to replace advice given to you by your health care provider. Make sure you discuss any questions you have with your health care provider. Document Revised: 01/02/2017 Document Reviewed: 01/14/2016 Elsevier Patient Education  2020 [employer].
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
## 2019-10-12 ENCOUNTER — Ambulatory Visit: Payer: Medicare Other | Admitting: Family Medicine

## 2019-11-09 DIAGNOSIS — Z0000 Encounter for general adult medical examination without abnormal findings: Secondary | ICD-10-CM | POA: Diagnosis not present

## 2019-11-09 DIAGNOSIS — Z131 Encounter for screening for diabetes mellitus: Secondary | ICD-10-CM | POA: Diagnosis not present

## 2019-11-09 DIAGNOSIS — Z125 Encounter for screening for malignant neoplasm of prostate: Secondary | ICD-10-CM | POA: Diagnosis not present

## 2019-11-09 DIAGNOSIS — I10 Essential (primary) hypertension: Secondary | ICD-10-CM | POA: Diagnosis not present

## 2019-11-09 DIAGNOSIS — M79604 Pain in right leg: Secondary | ICD-10-CM | POA: Diagnosis not present

## 2019-11-09 DIAGNOSIS — Z13220 Encounter for screening for lipoid disorders: Secondary | ICD-10-CM | POA: Diagnosis not present

## 2019-11-09 DIAGNOSIS — M5136 Other intervertebral disc degeneration, lumbar region: Secondary | ICD-10-CM | POA: Diagnosis not present

## 2019-11-09 DIAGNOSIS — F5221 Male erectile disorder: Secondary | ICD-10-CM | POA: Diagnosis not present

## 2019-11-09 DIAGNOSIS — E559 Vitamin D deficiency, unspecified: Secondary | ICD-10-CM | POA: Diagnosis not present

## 2019-11-09 DIAGNOSIS — J343 Hypertrophy of nasal turbinates: Secondary | ICD-10-CM | POA: Diagnosis not present

## 2019-11-17 DIAGNOSIS — M5136 Other intervertebral disc degeneration, lumbar region: Secondary | ICD-10-CM | POA: Diagnosis not present

## 2019-11-17 DIAGNOSIS — G894 Chronic pain syndrome: Secondary | ICD-10-CM | POA: Diagnosis not present

## 2019-11-17 DIAGNOSIS — M48062 Spinal stenosis, lumbar region with neurogenic claudication: Secondary | ICD-10-CM | POA: Diagnosis not present

## 2019-11-17 DIAGNOSIS — M79604 Pain in right leg: Secondary | ICD-10-CM | POA: Diagnosis not present

## 2019-11-25 ENCOUNTER — Other Ambulatory Visit: Payer: Self-pay

## 2019-11-25 ENCOUNTER — Other Ambulatory Visit: Payer: Self-pay | Admitting: Physician Assistant

## 2019-11-25 ENCOUNTER — Ambulatory Visit
Admission: RE | Admit: 2019-11-25 | Discharge: 2019-11-25 | Disposition: A | Discharge status: Home / Self Care | Payer: Medicare Other | Source: Ambulatory Visit | Attending: Physician Assistant | Admitting: Physician Assistant

## 2019-11-25 DIAGNOSIS — M545 Low back pain, unspecified: Secondary | ICD-10-CM | POA: Diagnosis not present

## 2019-11-25 DIAGNOSIS — R52 Pain, unspecified: Secondary | ICD-10-CM

## 2019-12-28 DIAGNOSIS — Z01818 Encounter for other preprocedural examination: Secondary | ICD-10-CM | POA: Diagnosis not present

## 2020-01-02 DIAGNOSIS — J342 Deviated nasal septum: Secondary | ICD-10-CM | POA: Diagnosis not present

## 2020-01-02 DIAGNOSIS — J343 Hypertrophy of nasal turbinates: Secondary | ICD-10-CM | POA: Diagnosis not present

## 2020-01-17 DIAGNOSIS — Z79899 Other long term (current) drug therapy: Secondary | ICD-10-CM | POA: Diagnosis not present

## 2020-01-17 DIAGNOSIS — M48062 Spinal stenosis, lumbar region with neurogenic claudication: Secondary | ICD-10-CM | POA: Diagnosis not present

## 2020-01-17 DIAGNOSIS — Z79891 Long term (current) use of opiate analgesic: Secondary | ICD-10-CM | POA: Diagnosis not present

## 2020-01-17 DIAGNOSIS — M5136 Other intervertebral disc degeneration, lumbar region: Secondary | ICD-10-CM | POA: Diagnosis not present

## 2020-01-17 DIAGNOSIS — M79604 Pain in right leg: Secondary | ICD-10-CM | POA: Diagnosis not present

## 2020-01-17 DIAGNOSIS — G894 Chronic pain syndrome: Secondary | ICD-10-CM | POA: Diagnosis not present

## 2020-03-02 ENCOUNTER — Other Ambulatory Visit: Payer: Self-pay | Admitting: Family Medicine

## 2020-03-02 DIAGNOSIS — I10 Essential (primary) hypertension: Secondary | ICD-10-CM

## 2020-03-02 NOTE — Telephone Encounter (Signed)
Pt is no longer seen by Dr Volanda Napoleon, please advise

## 2020-03-22 ENCOUNTER — Ambulatory Visit (INDEPENDENT_AMBULATORY_CARE_PROVIDER_SITE_OTHER): Payer: Medicare Other | Admitting: Podiatry

## 2020-03-22 ENCOUNTER — Other Ambulatory Visit: Payer: Self-pay

## 2020-03-22 DIAGNOSIS — B351 Tinea unguium: Secondary | ICD-10-CM

## 2020-03-22 DIAGNOSIS — M2042 Other hammer toe(s) (acquired), left foot: Secondary | ICD-10-CM

## 2020-03-22 DIAGNOSIS — M2041 Other hammer toe(s) (acquired), right foot: Secondary | ICD-10-CM | POA: Diagnosis not present

## 2020-03-22 MED ORDER — TERBINAFINE HCL 250 MG PO TABS: 250.0000 mg | ORAL_TABLET | Freq: Every day | ORAL | 0 refills | Status: AC

## 2020-03-22 NOTE — Progress Notes (Signed)
Subjective:   Patient ID: Alex Hogan, male   DOB: 60 y.o.   MRN: [id number]   HPI Patient presents stating that he has nail disease both feet that get thick and he cannot wear shoe gear comfortably.  States that he also has problems with his skin and other issues which occur and that this has been an ongoing issue for him.  Patient no longer smokes tries to be active   Review of Systems  All other systems reviewed and are negative.       Objective:  Physical Exam Vitals and nursing note reviewed.  Constitutional:      Appearance: He is well-developed and well-nourished.  Cardiovascular:     Pulses: Intact distal pulses.  Pulmonary:     Effort: Pulmonary effort is normal.  Musculoskeletal:        General: Normal range of motion.  Skin:    General: Skin is warm.  Neurological:     Mental Status: He is alert.     Neurovascular status intact muscle strength was found to be adequate currently range of motion adequate subtalar midtarsal joint.  Patient has nail disease with thick toenails that become dystrophic hard to cut and become painful and has moderate skin disease with dry skin formation.  Patient has good digital perfusion well oriented x3     Assessment:  Probability for mycotic nail infection bilateral along with skin disease and generalized pathology     Plan:  H&P reviewed condition and patient would like to pursue treatment and about 20 years ago he did oral Lamisil and did well with it.  I did explain the medication explaining risk factors and he is willing to accept risk and wants to start this and will do this for 90 days.  I am sending for liver function studies and he is getting it done today and I will get the results in the next several days and call him if I see any issues and he can begin the medicine starting next week.  Encouraged to call with questions concerns and will take 6 months to 1 year for these to grow out      lam

## 2020-06-16 ENCOUNTER — Other Ambulatory Visit: Payer: Self-pay | Admitting: Podiatry

## 2020-06-18 NOTE — Telephone Encounter (Signed)
Please advise 

## 2020-06-18 NOTE — Telephone Encounter (Signed)
This is not refilled. Should continue to provide benefit into the future

## 2020-08-14 ENCOUNTER — Other Ambulatory Visit: Payer: Self-pay | Admitting: Podiatry

## 2020-08-14 NOTE — Telephone Encounter (Signed)
Please advise 

## 2020-08-15 NOTE — Telephone Encounter (Signed)
Only to to be used for 3 months. Should continue to help over next few months

## 2020-10-19 ENCOUNTER — Other Ambulatory Visit: Payer: Self-pay | Admitting: Physician Assistant

## 2020-10-19 ENCOUNTER — Other Ambulatory Visit: Payer: Self-pay

## 2020-10-19 ENCOUNTER — Ambulatory Visit
Admission: RE | Admit: 2020-10-19 | Discharge: 2020-10-19 | Disposition: A | Discharge status: Home / Self Care | Payer: Medicaid Other | Source: Ambulatory Visit | Attending: Physician Assistant | Admitting: Physician Assistant

## 2020-10-19 DIAGNOSIS — M25511 Pain in right shoulder: Secondary | ICD-10-CM

## 2020-11-13 ENCOUNTER — Other Ambulatory Visit: Payer: Self-pay | Admitting: Podiatry

## 2020-11-13 ENCOUNTER — Other Ambulatory Visit: Payer: Self-pay | Admitting: Pain Medicine

## 2020-11-13 DIAGNOSIS — M12811 Other specific arthropathies, not elsewhere classified, right shoulder: Secondary | ICD-10-CM

## 2020-11-13 NOTE — Telephone Encounter (Signed)
Patient has not been seen since last refill on 03/22/20. Please call to schedule.

## 2020-11-21 NOTE — Telephone Encounter (Signed)
At this point you should know that we don't refill lamisil if they have taken for 90 days. You can explain to them that it continues to be effective and may take longer then 6 months. If not improved probably will not respond to antifungal therapy and the only thing we can do is trim and you can put on schedule of one the docs who does debridement

## 2020-11-21 NOTE — Telephone Encounter (Signed)
Patient needs to be scheduled with one of nail physicians

## 2020-11-21 NOTE — Telephone Encounter (Signed)
Returned the call to patient,no answer, left vmessage to call back to set up appointment w/ one of our debridement physicians.

## 2020-11-24 ENCOUNTER — Other Ambulatory Visit: Payer: Self-pay

## 2020-11-24 ENCOUNTER — Ambulatory Visit
Admission: RE | Admit: 2020-11-24 | Discharge: 2020-11-24 | Disposition: A | Discharge status: Home / Self Care | Payer: Medicare HMO | Source: Ambulatory Visit | Attending: Pain Medicine | Admitting: Pain Medicine

## 2020-11-24 DIAGNOSIS — M12811 Other specific arthropathies, not elsewhere classified, right shoulder: Secondary | ICD-10-CM

## 2021-04-18 DIAGNOSIS — M950 Acquired deformity of nose: Secondary | ICD-10-CM | POA: Insufficient documentation

## 2021-07-04 DIAGNOSIS — G894 Chronic pain syndrome: Secondary | ICD-10-CM | POA: Diagnosis not present

## 2021-07-04 DIAGNOSIS — M4726 Other spondylosis with radiculopathy, lumbar region: Secondary | ICD-10-CM | POA: Diagnosis not present

## 2021-07-04 DIAGNOSIS — M19011 Primary osteoarthritis, right shoulder: Secondary | ICD-10-CM | POA: Diagnosis not present

## 2021-07-04 DIAGNOSIS — Z79891 Long term (current) use of opiate analgesic: Secondary | ICD-10-CM | POA: Diagnosis not present

## 2021-07-11 DIAGNOSIS — Z131 Encounter for screening for diabetes mellitus: Secondary | ICD-10-CM | POA: Diagnosis not present

## 2021-07-11 DIAGNOSIS — Z0000 Encounter for general adult medical examination without abnormal findings: Secondary | ICD-10-CM | POA: Diagnosis not present

## 2021-07-11 DIAGNOSIS — E7849 Other hyperlipidemia: Secondary | ICD-10-CM | POA: Diagnosis not present

## 2021-07-11 DIAGNOSIS — I10 Essential (primary) hypertension: Secondary | ICD-10-CM | POA: Diagnosis not present

## 2021-07-11 DIAGNOSIS — Z125 Encounter for screening for malignant neoplasm of prostate: Secondary | ICD-10-CM | POA: Diagnosis not present

## 2021-07-11 DIAGNOSIS — F5221 Male erectile disorder: Secondary | ICD-10-CM | POA: Diagnosis not present

## 2021-07-16 DIAGNOSIS — Z79891 Long term (current) use of opiate analgesic: Secondary | ICD-10-CM | POA: Diagnosis not present

## 2021-07-16 DIAGNOSIS — Z833 Family history of diabetes mellitus: Secondary | ICD-10-CM | POA: Diagnosis not present

## 2021-07-16 DIAGNOSIS — I2510 Atherosclerotic heart disease of native coronary artery without angina pectoris: Secondary | ICD-10-CM | POA: Diagnosis not present

## 2021-07-16 DIAGNOSIS — Z87891 Personal history of nicotine dependence: Secondary | ICD-10-CM | POA: Diagnosis not present

## 2021-07-16 DIAGNOSIS — Z888 Allergy status to other drugs, medicaments and biological substances status: Secondary | ICD-10-CM | POA: Diagnosis not present

## 2021-07-16 DIAGNOSIS — Z8249 Family history of ischemic heart disease and other diseases of the circulatory system: Secondary | ICD-10-CM | POA: Diagnosis not present

## 2021-07-16 DIAGNOSIS — I10 Essential (primary) hypertension: Secondary | ICD-10-CM | POA: Diagnosis not present

## 2021-07-16 DIAGNOSIS — M1990 Unspecified osteoarthritis, unspecified site: Secondary | ICD-10-CM | POA: Diagnosis not present

## 2021-07-16 DIAGNOSIS — Z791 Long term (current) use of non-steroidal anti-inflammatories (NSAID): Secondary | ICD-10-CM | POA: Diagnosis not present

## 2021-08-01 DIAGNOSIS — Z79891 Long term (current) use of opiate analgesic: Secondary | ICD-10-CM | POA: Diagnosis not present

## 2021-08-01 DIAGNOSIS — G894 Chronic pain syndrome: Secondary | ICD-10-CM | POA: Diagnosis not present

## 2021-08-01 DIAGNOSIS — K5903 Drug induced constipation: Secondary | ICD-10-CM | POA: Diagnosis not present

## 2021-08-01 DIAGNOSIS — M47816 Spondylosis without myelopathy or radiculopathy, lumbar region: Secondary | ICD-10-CM | POA: Diagnosis not present

## 2021-08-01 DIAGNOSIS — M19011 Primary osteoarthritis, right shoulder: Secondary | ICD-10-CM | POA: Diagnosis not present

## 2021-09-03 DIAGNOSIS — Z79891 Long term (current) use of opiate analgesic: Secondary | ICD-10-CM | POA: Diagnosis not present

## 2021-09-03 DIAGNOSIS — M19011 Primary osteoarthritis, right shoulder: Secondary | ICD-10-CM | POA: Diagnosis not present

## 2021-09-03 DIAGNOSIS — G894 Chronic pain syndrome: Secondary | ICD-10-CM | POA: Diagnosis not present

## 2021-09-03 DIAGNOSIS — M47816 Spondylosis without myelopathy or radiculopathy, lumbar region: Secondary | ICD-10-CM | POA: Diagnosis not present

## 2021-09-11 ENCOUNTER — Ambulatory Visit (INDEPENDENT_AMBULATORY_CARE_PROVIDER_SITE_OTHER): Payer: Medicare HMO | Admitting: Podiatry

## 2021-09-11 ENCOUNTER — Encounter: Payer: Self-pay | Admitting: Podiatry

## 2021-09-11 DIAGNOSIS — L720 Epidermal cyst: Secondary | ICD-10-CM

## 2021-09-11 NOTE — Progress Notes (Signed)
  Subjective:  Patient ID: Alex Hogan, male    DOB: 1960-10-15,  MRN: [id number]  Chief Complaint  Patient presents with   fissure of  skin     NP - L GREAT TOE AT THE BOTTOM OF CREASE - SKIN SPLIT & A PAINFUL CALLUS HAS FORMED *NEED MEDICAID ABN CORN/CALLUS*    61 y.o. male presents with the above complaint. History confirmed with patient.  It is been present and worsening over the last couple weeks.  It is very painful for him Objective:  Physical Exam: warm, good capillary refill, no trophic changes or ulcerative lesions, normal DP and PT pulses, normal sensory exam, and small benign appearing hyperkeratotic lesion with central core and plug very painful at level of left hallux IPJ plantar   Assessment:   1. Epidermal inclusion cyst      Plan:  Patient was evaluated and treated and all questions answered.  I began with debridement of the overlying hyperkeratosis.  This revealed what appeared to be a small area in the skin and subcutaneous tissue.  Suspect this is likely a epidermal inclusion cyst.  I anesthetized his area with a local field block of 2 cc each of lidocaine and Marcaine.  No epinephrine was used.  The area was prepped with alcohol and the cyst was excavated and extracted using a sharp chisel blade.  It was dressed with antibiotic ointment, and salinocaine.  Sterile gauze and Coban was applied.  Post care instructions given will allow this to heal by secondary intention.  Advised on possibility of recurrence and he will return to see me to give some further issues.  Return in about 3 months (around 12/12/2021) for diabetic nail trim with Dr Prudence Davidson.

## 2021-09-17 DIAGNOSIS — M47816 Spondylosis without myelopathy or radiculopathy, lumbar region: Secondary | ICD-10-CM | POA: Diagnosis not present

## 2021-10-03 DIAGNOSIS — M47816 Spondylosis without myelopathy or radiculopathy, lumbar region: Secondary | ICD-10-CM | POA: Diagnosis not present

## 2021-10-09 DIAGNOSIS — M19011 Primary osteoarthritis, right shoulder: Secondary | ICD-10-CM | POA: Diagnosis not present

## 2021-10-09 DIAGNOSIS — Z79891 Long term (current) use of opiate analgesic: Secondary | ICD-10-CM | POA: Diagnosis not present

## 2021-10-09 DIAGNOSIS — G894 Chronic pain syndrome: Secondary | ICD-10-CM | POA: Diagnosis not present

## 2021-10-09 DIAGNOSIS — M47816 Spondylosis without myelopathy or radiculopathy, lumbar region: Secondary | ICD-10-CM | POA: Diagnosis not present

## 2021-10-23 DIAGNOSIS — M47816 Spondylosis without myelopathy or radiculopathy, lumbar region: Secondary | ICD-10-CM | POA: Diagnosis not present

## 2021-11-06 DIAGNOSIS — M47816 Spondylosis without myelopathy or radiculopathy, lumbar region: Secondary | ICD-10-CM | POA: Diagnosis not present

## 2021-11-07 DIAGNOSIS — M47816 Spondylosis without myelopathy or radiculopathy, lumbar region: Secondary | ICD-10-CM | POA: Diagnosis not present

## 2021-11-07 DIAGNOSIS — Z79891 Long term (current) use of opiate analgesic: Secondary | ICD-10-CM | POA: Diagnosis not present

## 2021-11-07 DIAGNOSIS — G894 Chronic pain syndrome: Secondary | ICD-10-CM | POA: Diagnosis not present

## 2021-11-07 DIAGNOSIS — M19011 Primary osteoarthritis, right shoulder: Secondary | ICD-10-CM | POA: Diagnosis not present

## 2021-11-07 DIAGNOSIS — M545 Low back pain, unspecified: Secondary | ICD-10-CM | POA: Diagnosis not present

## 2021-12-10 DIAGNOSIS — Z79891 Long term (current) use of opiate analgesic: Secondary | ICD-10-CM | POA: Diagnosis not present

## 2021-12-10 DIAGNOSIS — G894 Chronic pain syndrome: Secondary | ICD-10-CM | POA: Diagnosis not present

## 2021-12-10 DIAGNOSIS — M47816 Spondylosis without myelopathy or radiculopathy, lumbar region: Secondary | ICD-10-CM | POA: Diagnosis not present

## 2021-12-10 DIAGNOSIS — M545 Low back pain, unspecified: Secondary | ICD-10-CM | POA: Diagnosis not present

## 2022-01-07 DIAGNOSIS — Z79891 Long term (current) use of opiate analgesic: Secondary | ICD-10-CM | POA: Diagnosis not present

## 2022-01-07 DIAGNOSIS — G894 Chronic pain syndrome: Secondary | ICD-10-CM | POA: Diagnosis not present

## 2022-01-07 DIAGNOSIS — M545 Low back pain, unspecified: Secondary | ICD-10-CM | POA: Diagnosis not present

## 2022-01-07 DIAGNOSIS — M47816 Spondylosis without myelopathy or radiculopathy, lumbar region: Secondary | ICD-10-CM | POA: Diagnosis not present

## 2022-02-05 DIAGNOSIS — M47816 Spondylosis without myelopathy or radiculopathy, lumbar region: Secondary | ICD-10-CM | POA: Diagnosis not present

## 2022-02-05 DIAGNOSIS — Z79891 Long term (current) use of opiate analgesic: Secondary | ICD-10-CM | POA: Diagnosis not present

## 2022-02-05 DIAGNOSIS — M545 Low back pain, unspecified: Secondary | ICD-10-CM | POA: Diagnosis not present

## 2022-02-05 DIAGNOSIS — G894 Chronic pain syndrome: Secondary | ICD-10-CM | POA: Diagnosis not present

## 2022-02-12 ENCOUNTER — Encounter (HOSPITAL_COMMUNITY): Payer: Self-pay

## 2022-02-12 ENCOUNTER — Emergency Department (HOSPITAL_COMMUNITY)
Admission: EM | Admit: 2022-02-12 | Discharge: 2022-02-12 | Disposition: A | Discharge status: Home / Self Care | Payer: Medicare HMO | Attending: Emergency Medicine | Admitting: Emergency Medicine

## 2022-02-12 DIAGNOSIS — M79651 Pain in right thigh: Secondary | ICD-10-CM | POA: Diagnosis not present

## 2022-02-12 DIAGNOSIS — M79661 Pain in right lower leg: Secondary | ICD-10-CM | POA: Diagnosis not present

## 2022-02-12 DIAGNOSIS — M79604 Pain in right leg: Secondary | ICD-10-CM | POA: Diagnosis not present

## 2022-02-12 MED ORDER — PREDNISONE 20 MG PO TABS
60.0000 mg | ORAL_TABLET | Freq: Once | ORAL | Status: AC
Start: 2022-02-12 — End: 2022-02-12
  Administered 2022-02-12: 60 mg via ORAL
  Filled 2022-02-12: qty 3

## 2022-02-12 MED ORDER — KETOROLAC TROMETHAMINE 10 MG PO TABS: 10.0000 mg | ORAL_TABLET | Freq: Four times a day (QID) | ORAL | 0 refills | Status: AC | PRN

## 2022-02-12 MED ORDER — KETOROLAC TROMETHAMINE 30 MG/ML IJ SOLN
30.0000 mg | Freq: Once | INTRAMUSCULAR | Status: AC
Start: 2022-02-12 — End: 2022-02-12
  Administered 2022-02-12: 30 mg via INTRAMUSCULAR
  Filled 2022-02-12: qty 1

## 2022-02-12 MED ORDER — PREDNISONE 10 MG (21) PO TBPK: ORAL_TABLET | Freq: Every day | ORAL | 0 refills | Status: AC

## 2022-02-12 NOTE — ED Notes (Signed)
Dc instructions and scripts reviewed with pt no questions or concerns at this time. Will follow up with pcp as needed

## 2022-02-12 NOTE — Discharge Instructions (Addendum)
Please follow-up with neurology and your primary care doctor for further evaluation.  You likely have nerve inflammation, and this is causing your pain.  Take the steroids as prescribed, and use Toradol for pain control.  Do not take any other NSAIDs with Toradol, including ibuprofen, meloxicam, naproxen.  If you have any loss of bowel, bladder or foot drop please return to the ER.

## 2022-02-12 NOTE — ED Triage Notes (Signed)
Pt arrived via POV, c/o right thing pain, states feels like a shock. Denies any injury to area.

## 2022-02-12 NOTE — ED Provider Notes (Signed)
[facility] DEPT Provider Note   CSN: [id number] Arrival date & time: 02/12/22  1502     History  Chief Complaint  Patient presents with   Leg Pain    Alex Hogan is a 62 y.o. male, history of lumbar radiculitis, who presents to the ED secondary to right thigh pain that is been going on for the last day.  He states there is shocking stabbing pain that is localized to his right anterior thigh, that is been going on for the last day.  States that is extremely painful, and that he has had surgeries there before, but no recent surgeries.  Does not know is causing it.  Denies any injuries, no swelling of the leg, pain feels like a zap.  Denies any history of blood clots.  No redness or swelling.     Home Medications Prior to Admission medications   Medication Sig Start Date End Date Taking? Authorizing Provider  ketorolac (TORADOL) 10 MG tablet Take 1 tablet (10 mg total) by mouth every 6 (six) hours as needed. 02/12/22  Yes Anas Reister L, PA  predniSONE (STERAPRED UNI-PAK 21 TAB) 10 MG (21) TBPK tablet Take by mouth daily. Take 6 tabs by mouth daily  for 1 days, then 5 tabs for 2 days, then 4 tabs for 2 days, then 3 tabs for 2 days, 2 tabs for 2 days, then 1 tab by mouth daily for 2 days 02/12/22  Yes Haly Feher L, PA  amLODipine (NORVASC) 5 MG tablet Take 1 tablet (5 mg total) by mouth daily. 09/08/19   Billie Ruddy, MD  Azelastine HCl 137 MCG/SPRAY SOLN Place 2 sprays into both nostrils 2 (two) times daily. 08/12/21   [provider]  cyclobenzaprine (FLEXERIL) 10 MG tablet Take 10 mg by mouth daily as needed. 04/19/21   [provider]  fluticasone (FLONASE) 50 MCG/ACT nasal spray 2 sprays by Each Nare route daily. 04/27/20   [provider]  HYDROcodone-acetaminophen (NORCO) 7.5-325 MG tablet Take 1 tablet by mouth every 8 (eight) hours. 06/23/18   Marrian Salvage, FNP  losartan (COZAAR) 100 MG tablet Take 1 tablet  (100 mg total) by mouth daily. 04/21/19   Billie Ruddy, MD  meloxicam (MOBIC) 7.5 MG tablet Take by mouth. 08/05/21   [provider]  MOVANTIK 25 MG TABS tablet Take 25 mg by mouth daily. 08/01/21   [provider]  Multiple Vitamins-Minerals (MULTIVITAMIN WITH MINERALS) tablet Take 1 tablet by mouth daily.    [provider]  sildenafil (VIAGRA) 100 MG tablet Take 1 tablet (100 mg total) by mouth daily as needed for erectile dysfunction. 09/08/19   Billie Ruddy, MD  terbinafine (LAMISIL) 250 MG tablet Take 1 tablet (250 mg total) by mouth daily. 03/22/20   Wallene Huh, DPM      Allergies    Reglan [metoclopramide hcl]    Review of Systems   Review of Systems  Musculoskeletal:        +L anterior leg pain  Skin:  Negative for color change and rash.    Physical Exam Updated Vital Signs BP (!) 152/75 (BP Location: Left Arm)   Pulse (!) 57   Temp 98 F (36.7 C) (Oral)   Resp 16   Ht '6\' 2"'$  (1.88 m)   Wt 103.4 kg   SpO2 99%   BMI 29.27 kg/m  Physical Exam Vitals and nursing note reviewed.  Constitutional:      General:  He is not in acute distress.    Appearance: He is well-developed.  HENT:     Head: Normocephalic and atraumatic.  Eyes:     Conjunctiva/sclera: Conjunctivae normal.  Cardiovascular:     Rate and Rhythm: Normal rate and regular rhythm.     Pulses: Normal pulses.     Heart sounds: No murmur heard. Pulmonary:     Effort: Pulmonary effort is normal. No respiratory distress.     Breath sounds: Normal breath sounds.  Abdominal:     Palpations: Abdomen is soft.     Tenderness: There is no abdominal tenderness.  Musculoskeletal:        General: No swelling.     Cervical back: Neck supple.     Comments: +ttp along anterior medial shin w/o edema, induration or warmth. ROM intact. No edema.  Skin:    General: Skin is warm and dry.     Capillary Refill: Capillary refill takes less than 2 seconds.  Neurological:     Mental Status:  He is alert.  Psychiatric:        Mood and Affect: Mood normal.     ED Results / Procedures / Treatments   Labs (all labs ordered are listed, but only abnormal results are displayed) Labs Reviewed - No data to display  EKG None  Radiology No results found.  Procedures Procedures   Medications Ordered in ED Medications  ketorolac (TORADOL) 30 MG/ML injection 30 mg (has no administration in time range)  predniSONE (DELTASONE) tablet 60 mg (has no administration in time range)    ED Course/ Medical Decision Making/ A&P                           Medical Decision Making Patient is a well-appearing 62 year old male, here for shocking pain to the right anterior thigh.  Is been going on for about a day, has a history of surgery there, no recent surgeries.  History of chronic pain.  On exam he has mild tenderness to palpation of right anterior thigh, no swelling however, no redness or rash.  Pain is shooting stabbing, and feels like his typical nerve pain, does not radiate to his butt, negative straight leg raise, no back tenderness to palpation.  We discussed that he will need to follow-up with neurology as needed, for nerve testing, and follow-up with his PCP we will treat the pain with prednisone and ketorolac. Return precautions emphasized. He has not saddle anesthesia, loss of bowel, or bladder.  Risk Prescription drug management.   Final Clinical Impression(s) / ED Diagnoses Final diagnoses:  Leg pain, anterior, right    Rx / DC Orders ED Discharge Orders          Ordered    ketorolac (TORADOL) 10 MG tablet  Every 6 hours PRN        02/12/22 1625    predniSONE (STERAPRED UNI-PAK 21 TAB) 10 MG (21) TBPK tablet  Daily        02/12/22 1625              Massiah Minjares, [place], PA 02/12/22 1634    Milton Ferguson, MD 02/15/22 [phone]

## 2022-02-21 ENCOUNTER — Encounter: Payer: Self-pay | Admitting: Neurology

## 2022-02-24 ENCOUNTER — Telehealth: Payer: Self-pay

## 2022-02-24 NOTE — Telephone Encounter (Signed)
        Patient  visited [facility] on 02/12/2022  for Leg pain, anterior, right.   Telephone encounter attempt :  1st  A HIPAA compliant voice message was left requesting a return call.  Instructed patient to call back at [phone].   [facility] Resource Care Guide   [email or web address]  ?? [phone]   Website: [email or web address]  [email or web address]

## 2022-02-25 ENCOUNTER — Telehealth: Payer: Self-pay

## 2022-02-25 NOTE — Telephone Encounter (Signed)
     Patient  visit on 02/12/2022  at [facility] was for Leg pain, anterior, right.  Have you been able to follow up with your primary care physician? No. Patient will follow up as needed.  The patient was or was not able to obtain any needed medicine or equipment. Patient obtained medication.  Are there diet recommendations that you are having difficulty following? No  Patient expresses understanding of discharge instructions and education provided has no other needs at this time.    [place] Resource Care Guide   ??[email or web address]  ?? [phone]   Website: [email or web address]  [email or web address]

## 2022-03-05 DIAGNOSIS — G894 Chronic pain syndrome: Secondary | ICD-10-CM | POA: Diagnosis not present

## 2022-03-05 DIAGNOSIS — M545 Low back pain, unspecified: Secondary | ICD-10-CM | POA: Diagnosis not present

## 2022-03-05 DIAGNOSIS — I2510 Atherosclerotic heart disease of native coronary artery without angina pectoris: Secondary | ICD-10-CM | POA: Diagnosis not present

## 2022-03-05 DIAGNOSIS — I10 Essential (primary) hypertension: Secondary | ICD-10-CM | POA: Diagnosis not present

## 2022-03-05 DIAGNOSIS — Z79891 Long term (current) use of opiate analgesic: Secondary | ICD-10-CM | POA: Diagnosis not present

## 2022-03-05 DIAGNOSIS — E7849 Other hyperlipidemia: Secondary | ICD-10-CM | POA: Diagnosis not present

## 2022-03-05 DIAGNOSIS — M47816 Spondylosis without myelopathy or radiculopathy, lumbar region: Secondary | ICD-10-CM | POA: Diagnosis not present
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
## 2022-03-25 ENCOUNTER — Encounter: Payer: Self-pay | Admitting: Neurology

## 2022-03-25 ENCOUNTER — Ambulatory Visit: Payer: Medicare HMO | Admitting: Neurology

## 2022-04-08 DIAGNOSIS — Z79891 Long term (current) use of opiate analgesic: Secondary | ICD-10-CM | POA: Diagnosis not present

## 2022-04-08 DIAGNOSIS — M47816 Spondylosis without myelopathy or radiculopathy, lumbar region: Secondary | ICD-10-CM | POA: Diagnosis not present

## 2022-04-08 DIAGNOSIS — G894 Chronic pain syndrome: Secondary | ICD-10-CM | POA: Diagnosis not present

## 2022-04-08 DIAGNOSIS — M545 Low back pain, unspecified: Secondary | ICD-10-CM | POA: Diagnosis not present

## 2022-05-07 DIAGNOSIS — Z79891 Long term (current) use of opiate analgesic: Secondary | ICD-10-CM | POA: Diagnosis not present

## 2022-05-07 DIAGNOSIS — M47816 Spondylosis without myelopathy or radiculopathy, lumbar region: Secondary | ICD-10-CM | POA: Diagnosis not present

## 2022-05-07 DIAGNOSIS — G894 Chronic pain syndrome: Secondary | ICD-10-CM | POA: Diagnosis not present

## 2022-05-07 DIAGNOSIS — M545 Low back pain, unspecified: Secondary | ICD-10-CM | POA: Diagnosis not present

## 2022-06-04 DIAGNOSIS — Z79891 Long term (current) use of opiate analgesic: Secondary | ICD-10-CM | POA: Diagnosis not present

## 2022-06-04 DIAGNOSIS — G894 Chronic pain syndrome: Secondary | ICD-10-CM | POA: Diagnosis not present

## 2022-06-04 DIAGNOSIS — M545 Low back pain, unspecified: Secondary | ICD-10-CM | POA: Diagnosis not present

## 2022-06-04 DIAGNOSIS — M47816 Spondylosis without myelopathy or radiculopathy, lumbar region: Secondary | ICD-10-CM | POA: Diagnosis not present

## 2022-06-10 DIAGNOSIS — Z79891 Long term (current) use of opiate analgesic: Secondary | ICD-10-CM | POA: Diagnosis not present

## 2022-07-02 DIAGNOSIS — G894 Chronic pain syndrome: Secondary | ICD-10-CM | POA: Diagnosis not present

## 2022-07-02 DIAGNOSIS — M47816 Spondylosis without myelopathy or radiculopathy, lumbar region: Secondary | ICD-10-CM | POA: Diagnosis not present

## 2022-07-02 DIAGNOSIS — M545 Low back pain, unspecified: Secondary | ICD-10-CM | POA: Diagnosis not present

## 2022-07-02 DIAGNOSIS — Z79891 Long term (current) use of opiate analgesic: Secondary | ICD-10-CM | POA: Diagnosis not present

## 2022-07-30 DIAGNOSIS — M545 Low back pain, unspecified: Secondary | ICD-10-CM | POA: Diagnosis not present

## 2022-07-30 DIAGNOSIS — M19011 Primary osteoarthritis, right shoulder: Secondary | ICD-10-CM | POA: Diagnosis not present

## 2022-07-30 DIAGNOSIS — G894 Chronic pain syndrome: Secondary | ICD-10-CM | POA: Diagnosis not present

## 2022-07-30 DIAGNOSIS — M47816 Spondylosis without myelopathy or radiculopathy, lumbar region: Secondary | ICD-10-CM | POA: Diagnosis not present

## 2022-07-30 DIAGNOSIS — Z79891 Long term (current) use of opiate analgesic: Secondary | ICD-10-CM | POA: Diagnosis not present

## 2022-09-23 ENCOUNTER — Other Ambulatory Visit: Payer: Self-pay | Admitting: Internal Medicine

## 2022-09-24 LAB — COMPLETE METABOLIC PANEL WITH GFR
AG Ratio: 1.6 (calc) (ref 1.0–2.5)
ALT: 12 U/L (ref 9–46)
AST: 14 U/L (ref 10–35)
Albumin: 4.4 g/dL (ref 3.6–5.1)
Alkaline phosphatase (APISO): 47 U/L (ref 35–144)
BUN: 19 mg/dL (ref 7–25)
CO2: 26 mmol/L (ref 20–32)
Calcium: 10 mg/dL (ref 8.6–10.3)
Chloride: 103 mmol/L (ref 98–110)
Creat: 1.2 mg/dL (ref 0.70–1.35)
Globulin: 2.8 g/dL (ref 1.9–3.7)
Glucose, Bld: 55 mg/dL — ABNORMAL LOW (ref 65–99)
Potassium: 4.3 mmol/L (ref 3.5–5.3)
Sodium: 138 mmol/L (ref 135–146)
Total Bilirubin: 0.6 mg/dL (ref 0.2–1.2)
Total Protein: 7.2 g/dL (ref 6.1–8.1)
eGFR: 68 mL/min/{1.73_m2} (ref >=60)

## 2022-09-24 LAB — LIPID PANEL
Cholesterol: 181 mg/dL (ref <200)
HDL: 64 mg/dL (ref >=40)
LDL Cholesterol (Calc): 104 mg/dL — ABNORMAL HIGH
Non-HDL Cholesterol (Calc): 117 mg/dL (ref <130)
Total CHOL/HDL Ratio: 2.8 (calc) (ref <5.0)
Triglycerides: 50 mg/dL (ref <150)

## 2022-09-24 LAB — CBC
HCT: 43.7 % (ref 38.5–50.0)
Hemoglobin: 14.3 g/dL (ref 13.2–17.1)
MCH: 29.2 pg (ref 27.0–33.0)
MCHC: 32.7 g/dL (ref 32.0–36.0)
MCV: 89.2 fL (ref 80.0–100.0)
MPV: 10 fL (ref 7.5–12.5)
Platelets: 272 10*3/uL (ref 140–400)
RBC: 4.9 10*6/uL (ref 4.20–5.80)
RDW: 13.2 % (ref 11.0–15.0)
WBC: 6.7 10*3/uL (ref 3.8–10.8)

## 2022-09-24 LAB — PSA: PSA: 2.61 ng/mL (ref <=4.00)

## 2022-09-24 LAB — TSH: TSH: 3 m[IU]/L (ref 0.40–4.50)

## 2022-09-24 LAB — VITAMIN D 25 HYDROXY (VIT D DEFICIENCY, FRACTURES): Vit D, 25-Hydroxy: 38 ng/mL (ref 30–100)
